# Patient Record
Sex: Female | Born: 1965 | Race: White | Hispanic: No | Marital: Married | Smoking: Never smoker
Health system: Southern US, Community
[De-identification: ages and names within clinical notes are randomized; demographics above are authoritative.]

## PROBLEM LIST (undated history)

## (undated) DIAGNOSIS — F419 Anxiety disorder, unspecified: Secondary | ICD-10-CM

## (undated) DIAGNOSIS — M1990 Unspecified osteoarthritis, unspecified site: Secondary | ICD-10-CM

## (undated) DIAGNOSIS — I10 Essential (primary) hypertension: Secondary | ICD-10-CM

## (undated) DIAGNOSIS — F32A Depression, unspecified: Secondary | ICD-10-CM

## (undated) DIAGNOSIS — F329 Major depressive disorder, single episode, unspecified: Secondary | ICD-10-CM

## (undated) DIAGNOSIS — K219 Gastro-esophageal reflux disease without esophagitis: Secondary | ICD-10-CM

## (undated) HISTORY — PX: CHOLECYSTECTOMY: SHX55

## (undated) HISTORY — PX: ABDOMINAL HYSTERECTOMY: SHX81

---
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
## 2013-09-27 ENCOUNTER — Ambulatory Visit: Payer: Self-pay | Admitting: Family Medicine

## 2014-05-16 ENCOUNTER — Ambulatory Visit: Payer: Self-pay | Admitting: Emergency Medicine

## 2015-07-23 ENCOUNTER — Ambulatory Visit
Admission: EM | Admit: 2015-07-23 | Discharge: 2015-07-23 | Disposition: A | Discharge status: Home / Self Care | Payer: BC Managed Care – PPO | Attending: Family Medicine | Admitting: Family Medicine

## 2015-07-23 DIAGNOSIS — B349 Viral infection, unspecified: Secondary | ICD-10-CM | POA: Diagnosis not present

## 2015-07-23 HISTORY — DX: Gastro-esophageal reflux disease without esophagitis: K21.9

## 2015-07-23 HISTORY — DX: Essential (primary) hypertension: I10

## 2015-07-23 LAB — RAPID INFLUENZA A&B ANTIGENS
Influenza A (ARMC): NOT DETECTED
Influenza B (ARMC): NOT DETECTED

## 2015-07-23 LAB — RAPID STREP SCREEN (MED CTR MEBANE ONLY): STREPTOCOCCUS, GROUP A SCREEN (DIRECT): NEGATIVE

## 2015-07-23 NOTE — ED Notes (Signed)
Started 3 days ago with headache, sore throat. + bodyaches started yesterday. + chills

## 2015-07-23 NOTE — Discharge Instructions (Signed)
Tylenol/Motrin as needed, Delysm as needed, Claritin as needed

## 2015-07-23 NOTE — ED Provider Notes (Signed)
CSN: [id number]     Arrival date & time 07/23/15  1501 History   First MD Initiated Contact with Patient 07/23/15 1604     Chief Complaint  Patient presents with  . Influenza   (Consider location/radiation/quality/duration/timing/severity/associated sxs/prior Treatment) HPI: Patient presents today with symptoms of headache, sore throat, myalgias, chills for the last few days. Patient is afebrile in the office today. Denies any chest pain, shortness of breath or nausea or vomiting or diarrhea. She is not taking any medications today.  Past Medical History  Diagnosis Date  . Hypertension   . GERD (gastroesophageal reflux disease)    Past Surgical History  Procedure Laterality Date  . Cholecystectomy    . Abdominal hysterectomy    . Cesarean section  1991   Family History  Problem Relation Age of Onset  . Hypertension Mother   . Diabetes Mother   . Heart failure Father    Social History  Substance Use Topics  . Smoking status: Passive Smoke Exposure - Never Smoker  . Smokeless tobacco: None  . Alcohol Use: No   OB History    No data available     Review of Systems: Negative except mentioned above.   Allergies  Review of patient's allergies indicates no known allergies.  Home Medications   Prior to Admission medications   Medication Sig Start Date End Date Taking? Authorizing Provider  buPROPion (ZYBAN) 150 MG 12 hr tablet Take 150 mg by mouth 2 (two) times daily.   Yes Historical Provider, MD  estradiol (ESTRACE) 1 MG tablet Take 1 mg by mouth daily.   Yes Historical Provider, MD  lansoprazole (PREVACID) 30 MG capsule Take 30 mg by mouth daily at 12 noon.   Yes Historical Provider, MD  lisinopril-hydrochlorothiazide (PRINZIDE,ZESTORETIC) 10-12.5 MG tablet Take 1 tablet by mouth daily.   Yes Historical Provider, MD   Meds Ordered and Administered this Visit  Medications - No data to display  BP 143/88 mmHg  Pulse 91  Temp(Src) 97.4 F (36.3 C) (Tympanic)  Resp 16   Ht  (1.753 m)  Wt 252 lb (114.306 kg)  BMI 37.20 kg/m2  SpO2 98% No data found.   Physical Exam   GENERAL: NAD HEENT: mild pharyngeal erythema, no exudate, no erythema of TMs, no cervical LAD RESP: CTA B CARD: RRR ABD: +BS, NT/ND, no rebound or guarding  NEURO: CN II-XII grossly intact, negative meningeal signs   ED Course  Procedures (including critical care time)  Labs Review Labs Reviewed  RAPID STREP SCREEN (NOT AT [facility])  RAPID INFLUENZA A&B ANTIGENS ([facility] ONLY)    Imaging Review No results found.  MDM  A/P: Viral illness-rapid strep test was negative as well as flu screen, will treat patient with Claritin when necessary, Delsym when necessary, Tylenol/Motrin when necessary, rest, hydration, seek medical attention if symptoms persist or worsen as discussed.    Jolene Provost, MD 07/23/15 [phone]

## 2015-07-25 LAB — CULTURE, GROUP A STREP (THRC)

## 2016-07-12 ENCOUNTER — Ambulatory Visit
Admission: EM | Admit: 2016-07-12 | Discharge: 2016-07-12 | Disposition: A | Discharge status: Home / Self Care | Payer: BC Managed Care – PPO | Attending: Family Medicine | Admitting: Family Medicine

## 2016-07-12 ENCOUNTER — Ambulatory Visit (INDEPENDENT_AMBULATORY_CARE_PROVIDER_SITE_OTHER): Payer: BC Managed Care – PPO

## 2016-07-12 DIAGNOSIS — M79642 Pain in left hand: Secondary | ICD-10-CM

## 2016-07-12 MED ORDER — TRAMADOL HCL 50 MG PO TABS: 50.0000 mg | ORAL_TABLET | Freq: Three times a day (TID) | ORAL | 0 refills | Status: DC | PRN

## 2016-07-12 NOTE — ED Provider Notes (Signed)
[facility]    CSN: [id number] Arrival date & time: 07/12/16  1706  History   Chief Complaint Chief Complaint  Patient presents with  . Hand Pain    left   HPI 51 year old female presents with complaints of left hand pain.  Patient states that she was trying on shoes and attempted to don a shoe. In doing so she injured her left hand (at the MCP). She reports that she heard a "pop". She reports associated swelling. She has been icing the area and using ibuprofen without improvement. She reports continued pain. Decreased range of motion of the left index finger. No other associated symptoms. Pain is moderate in severity. No other complaints or concerns at this time.  Past Medical History:  Diagnosis Date  . GERD (gastroesophageal reflux disease)   . Hypertension    Past Surgical History:  Procedure Laterality Date  . ABDOMINAL HYSTERECTOMY    . CESAREAN SECTION  1991  . CHOLECYSTECTOMY     OB History    No data available     Home Medications    Prior to Admission medications   Medication Sig Start Date End Date Taking? Authorizing Provider  buPROPion (ZYBAN) 150 MG 12 hr tablet Take 150 mg by mouth 2 (two) times daily.   Yes Historical Provider, MD  estradiol (ESTRACE) 1 MG tablet Take 1 mg by mouth daily.   Yes Historical Provider, MD  lansoprazole (PREVACID) 30 MG capsule Take 30 mg by mouth daily at 12 noon.   Yes Historical Provider, MD  lisinopril-hydrochlorothiazide (PRINZIDE,ZESTORETIC) 10-12.5 MG tablet Take 1 tablet by mouth daily.   Yes Historical Provider, MD  traMADol (ULTRAM) 50 MG tablet Take 1 tablet (50 mg total) by mouth every 8 (eight) hours as needed. 07/12/16   Tommie SamsJayce G Kearston Putman, DO   Family History Family History  Problem Relation Age of Onset  . Hypertension Mother   . Diabetes Mother   . Heart failure Father    Social History Social History  Substance Use Topics  . Smoking status: Passive Smoke Exposure - Never Smoker  . Smokeless  tobacco: Never Used  . Alcohol use No   Allergies   Patient has no known allergies.  Review of Systems Review of Systems  Constitutional: Negative.   Musculoskeletal:       Left hand pain.   Physical Exam Triage Vital Signs ED Triage Vitals  Enc Vitals Group     BP 07/12/16 1739 129/80     Pulse Rate 07/12/16 1739 88     Resp 07/12/16 1739 17     Temp 07/12/16 1739 98.3 F (36.8 C)     Temp Source 07/12/16 1739 Oral     SpO2 07/12/16 1739 98 %     Weight 07/12/16 1737 260 lb (117.9 kg)     Height 07/12/16 1737 5\' 8"  (1.727 m)     Head Circumference --      Peak Flow --      Pain Score 07/12/16 1739 5     Pain Loc --      Pain Edu? --      Excl. in GC? --    Updated Vital Signs BP 129/80 (BP Location: Left Arm)   Pulse 88   Temp 98.3 F (36.8 C) (Oral)   Resp 17   Ht 5\' 8"  (1.727 m)   Wt 260 lb (117.9 kg)   SpO2 98%   BMI 39.53 kg/m   Physical Exam  Constitutional: She appears  well-developed. No distress.  Pulmonary/Chest: Effort normal.  Musculoskeletal:  Left hand - swelling at the 1st MCP (dorsally), extending proximally (slightly). Tenderness at the 1st MCP.  Neurovascularly intact.  Skin: Skin is warm. No rash noted.  Psychiatric: She has a normal mood and affect.  Vitals reviewed.  UC Treatments / Results  Labs (all labs ordered are listed, but only abnormal results are displayed) Labs Reviewed - No data to display  EKG  EKG Interpretation None      Radiology Dg Hand Complete Left  Result Date: 07/12/2016 CLINICAL DATA:  LEFT hand pain, heard a pop and had swelling after pulling hard while putting on a shoe on Sunday, pain fourth finger EXAM: LEFT HAND - COMPLETE 3+ VIEW COMPARISON:  None FINDINGS: Fingers partially superimposed on lateral view limiting assessment. Osseous mineralization normal. Mild degenerative changes at first CMC joint. Remaining joint spaces preserved. No acute fracture, dislocation, or bone destruction. IMPRESSION: No  acute osseous abnormalities. Electronically Signed   By: Mark  Boles M.D.   On: 07/12/2016 18:15   Procedures Procedures (including critical care time)  Medications Ordered in UC Medications - No data to display  Initial Impression / Assessment and Plan / UC Course  I have reviewed the triage vital signs and the nursing notes.  Pertinent labs & imaging results that were available during my care of the patient were reviewed by me and considered in my medical decision making (see chart for details).     50  year old female presenting with a left hand injury. X-ray negative. Supportive care. RICE. PRN Tramadol.  Final Clinical Impressions(s) / UC Diagnoses   Final diagnoses:  Left hand pain    New Prescriptions New Prescriptions   TRAMADOL (ULTRAM) 50 MG TABLET    Take 1 tablet (50 mg total) by mouth every 8 (eight) hours as needed.     Tommie Sams, DO 07/12/16 1610

## 2016-07-12 NOTE — Discharge Instructions (Signed)
RICE.  Ibuprofen as needed.  Tramadol as needed.  Take care  Dr. Adriana Simasook

## 2016-07-12 NOTE — ED Triage Notes (Signed)
Patient complains of left hand pain that occurred after she was trying to put on a shoe on Sunday. She states that she pulled the shoe hard and heard a pop in her hand and now has swelling. Patient is unable to hold any weight with finger. Patient states that she has been icing area and using motrin with little relief.

## 2016-08-17 NOTE — Progress Notes (Signed)
Ineligible as a potential kidney dnor due to HTN on multiople medications and significant family hx of HTN/ESRD

## 2017-07-24 ENCOUNTER — Ambulatory Visit (INDEPENDENT_AMBULATORY_CARE_PROVIDER_SITE_OTHER): Payer: BC Managed Care – PPO

## 2017-07-24 ENCOUNTER — Other Ambulatory Visit: Payer: Self-pay

## 2017-07-24 ENCOUNTER — Ambulatory Visit
Admission: EM | Admit: 2017-07-24 | Discharge: 2017-07-24 | Disposition: A | Discharge status: Home / Self Care | Payer: BC Managed Care – PPO | Attending: Family Medicine | Admitting: Family Medicine

## 2017-07-24 DIAGNOSIS — R05 Cough: Secondary | ICD-10-CM

## 2017-07-24 DIAGNOSIS — R059 Cough, unspecified: Secondary | ICD-10-CM

## 2017-07-24 MED ORDER — HYDROCOD POLST-CPM POLST ER 10-8 MG/5ML PO SUER: 5.0000 mL | Freq: Two times a day (BID) | ORAL | 0 refills | Status: DC | PRN

## 2017-07-24 NOTE — ED Triage Notes (Signed)
Pt with 10 days of "deep cough" non-productive and worse at night. No fever, or other sx except some congestion mostly at night.

## 2017-07-24 NOTE — ED Provider Notes (Signed)
[facility]    CSN: [id number] Arrival date & time: 07/24/17  1727  History   Chief Complaint Chief Complaint  Patient presents with  . Cough   HPI 52 year old female presents with cough.  Patient reports a 10-day history of cough.  Nonproductive.  Dry.  No associated fever.  She reports occasional shortness of breath with paroxysms of cough.  She is used [employer] with improvement but no resolution.  No known exacerbating factors.  No other associated symptoms.  No other complaints at this time.  Of note, patient states that she has a history of pneumonia.  This is her primary concern today.  Past Medical History:  Diagnosis Date  . GERD (gastroesophageal reflux disease)   . GERD (gastroesophageal reflux disease)   . Hypertension    Past Surgical History:  Procedure Laterality Date  . ABDOMINAL HYSTERECTOMY    . CESAREAN SECTION  1991  . CHOLECYSTECTOMY      OB History    No data available       Home Medications    Prior to Admission medications   Medication Sig Start Date End Date Taking? Authorizing Provider  chlorpheniramine-HYDROcodone (TUSSIONEX PENNKINETIC ER) 10-8 MG/5ML SUER Take 5 mLs by mouth every 12 (twelve) hours as needed. 07/24/17   Tommie Samsook, [employer] G, DO  estradiol (ESTRACE) 1 MG tablet Take 1 mg by mouth daily.    [provider]  lansoprazole (PREVACID) 30 MG capsule Take 30 mg by mouth daily at 12 noon.    [provider]  lisinopril-hydrochlorothiazide (PRINZIDE,ZESTORETIC) 10-12.5 MG tablet Take 1 tablet by mouth daily.    [provider]    Family History Family History  Problem Relation Age of Onset  . Hypertension Mother   . Diabetes Mother   . Heart failure Father     Social History Social History   Tobacco Use  . Smoking status: Passive Smoke Exposure - Never Smoker  . Smokeless tobacco: Never Used  Substance Use Topics  . Alcohol use: No  . Drug use: No     Allergies   Patient has  no known allergies.   Review of Systems Review of Systems  Constitutional: Negative.   Respiratory: Positive for cough.    Physical Exam Triage Vital Signs ED Triage Vitals  Enc Vitals Group     BP 07/24/17 1747 133/81     Pulse Rate 07/24/17 1747 71     Resp 07/24/17 1747 20     Temp 07/24/17 1747 98.2 F (36.8 C)     Temp Source 07/24/17 1747 Oral     SpO2 07/24/17 1747 96 %     Weight 07/24/17 1747 230 lb (104.3 kg)     Height 07/24/17 1747 5' 8.5" (1.74 m)     Head Circumference --      Peak Flow --      Pain Score 07/24/17 2003 3     Pain Loc --      Pain Edu? --      Excl. in GC? --    Updated Vital Signs BP 133/81 (BP Location: Left Arm)   Pulse 71   Temp 98.2 F (36.8 C) (Oral)   Resp 20   Ht 5' 8.5" (1.74 m)   Wt 230 lb (104.3 kg)   SpO2 96%   BMI 34.46 kg/m     Physical Exam  Constitutional: She is oriented to person, place, and time. She appears well-developed and well-nourished. No distress.  HENT:  Head: Normocephalic and atraumatic.  Mouth/Throat: Oropharynx is clear and moist.  Cardiovascular: Normal rate and regular rhythm.  No murmur heard. Pulmonary/Chest: Effort normal and breath sounds normal. She has no wheezes. She has no rales.  Neurological: She is alert and oriented to person, place, and time.  Psychiatric: She has a normal mood and affect. Her behavior is normal.  Nursing note and vitals reviewed.  UC Treatments / Results  Labs (all labs ordered are listed, but only abnormal results are displayed) Labs Reviewed - No data to display  EKG  EKG Interpretation None       Radiology Dg Chest 2 View  Result Date: 07/24/2017 CLINICAL DATA:  Shortness of breath, cough. EXAM: CHEST  2 VIEW COMPARISON:  None. FINDINGS: The heart size and mediastinal contours are within normal limits. Both lungs are clear. No pneumothorax or pleural effusion is noted. The visualized skeletal structures are unremarkable. IMPRESSION: No active  cardiopulmonary disease. Electronically Signed   By: Lupita Raider, M.D.   On: 07/24/2017 19:45    Procedures Procedures (including critical care time)  Medications Ordered in UC Medications - No data to display   Initial Impression / Assessment and Plan / UC Course  I have reviewed the triage vital signs and the nursing notes.  Pertinent labs & imaging results that were available during my care of the patient were reviewed by me and considered in my medical decision making (see chart for details).     52 year old female presents with cough.  Appears to be viral in origin or post viral.  X-ray negative.  Tussionex for cough.  Supportive care.  Final Clinical Impressions(s) / UC Diagnoses   Final diagnoses:  Cough    ED Discharge Orders        Ordered    chlorpheniramine-HYDROcodone (TUSSIONEX PENNKINETIC ER) 10-8 MG/5ML SUER  Every 12 hours PRN     07/24/17 1957     Controlled Substance Prescriptions [place] Controlled Substance Registry consulted? No   Tommie Sams, [place] 07/24/17 2151

## 2018-12-20 ENCOUNTER — Other Ambulatory Visit: Payer: Self-pay | Admitting: Sports Physician

## 2018-12-20 ENCOUNTER — Ambulatory Visit
Admission: RE | Admit: 2018-12-20 | Discharge: 2018-12-20 | Disposition: A | Discharge status: Home / Self Care | Payer: BC Managed Care – PPO | Source: Ambulatory Visit | Attending: Sports Physician | Admitting: Sports Physician

## 2018-12-20 ENCOUNTER — Other Ambulatory Visit: Payer: Self-pay

## 2018-12-20 ENCOUNTER — Ambulatory Visit
Admission: RE | Admit: 2018-12-20 | Discharge: 2018-12-20 | Disposition: A | Discharge status: Home / Self Care | Payer: BC Managed Care – PPO | Attending: Sports Physician | Admitting: Sports Physician

## 2018-12-20 DIAGNOSIS — M25512 Pain in left shoulder: Secondary | ICD-10-CM

## 2019-07-08 ENCOUNTER — Other Ambulatory Visit: Payer: Self-pay | Admitting: Orthopedic Surgery

## 2019-07-08 DIAGNOSIS — M25512 Pain in left shoulder: Secondary | ICD-10-CM

## 2019-07-16 ENCOUNTER — Other Ambulatory Visit: Payer: Self-pay

## 2019-07-16 ENCOUNTER — Ambulatory Visit
Admission: RE | Admit: 2019-07-16 | Discharge: 2019-07-16 | Disposition: A | Discharge status: Home / Self Care | Payer: BC Managed Care – PPO | Source: Ambulatory Visit | Attending: Orthopedic Surgery | Admitting: Orthopedic Surgery

## 2019-07-16 DIAGNOSIS — M25512 Pain in left shoulder: Secondary | ICD-10-CM

## 2019-11-04 ENCOUNTER — Other Ambulatory Visit: Payer: Self-pay | Admitting: Orthopedic Surgery

## 2019-11-12 ENCOUNTER — Other Ambulatory Visit: Payer: Self-pay

## 2019-11-12 ENCOUNTER — Encounter
Admission: RE | Admit: 2019-11-12 | Discharge: 2019-11-12 | Disposition: A | Discharge status: Home / Self Care | Payer: BC Managed Care – PPO | Source: Ambulatory Visit | Attending: Orthopedic Surgery | Admitting: Orthopedic Surgery

## 2019-11-12 HISTORY — DX: Depression, unspecified: F32.A

## 2019-11-12 HISTORY — DX: Unspecified osteoarthritis, unspecified site: M19.90

## 2019-11-12 HISTORY — DX: Anxiety disorder, unspecified: F41.9

## 2019-11-12 NOTE — Patient Instructions (Signed)
Your procedure is scheduled on: Monday November 18, 2019 Report to Day Surgery. To find out your arrival time please call [phone] between 1PM - 3PM on Friday November 15, 2019.  Remember: Instructions that are not followed completely may result in serious medical risk,  up to and including death, or upon the discretion of your surgeon and anesthesiologist your  surgery may need to be rescheduled.     _X__ 1. Do not eat food after midnight the night before your procedure.                 No gum chewing or hard candies. You may drink clear liquids up to 2 hours                 before you are scheduled to arrive for your surgery- DO not drink clear                 liquids within 2 hours of the start of your surgery.                 Clear Liquids include:  water, apple juice without pulp, clear Gatorade, G2 or                  Gatorade Zero (avoid Red/Purple/Blue), Black Coffee or Tea (Do not add                 anything to coffee or tea).  __X__2.   Complete the carbohydrate drink provided to you, 2 hours before arrival.  __X__3.  On the morning of surgery brush your teeth with toothpaste and water, you                may rinse your mouth with mouthwash if you wish.  Do not swallow any toothpaste of mouthwash.     _X__ 4.  No Alcohol for 24 hours before or after surgery.   _X__ 5.  Do Not Smoke or use e-cigarettes For 24 Hours Prior to Your Surgery.                 Do not use any chewable tobacco products for at least 6 hours prior to                 Surgery.  _X__  6.  Do not use any recreational drugs (marijuana, cocaine, heroin, ecstacy, MDMA or other)                For at least one week prior to your surgery.  Combination of these drugs with anesthesia                May have life threatening results.  __X__ 7.  Notify your doctor if there is any change in your medical condition      (cold, fever, infections).     Do not wear jewelry, make-up, hairpins, clips  or nail polish. Do not wear lotions, powders, or perfumes. You may wear deodorant. Do not shave 48 hours prior to surgery. Men may shave face and neck. Do not bring valuables to the hospital.    [facility] is not responsible for any belongings or valuables.  Contacts, dentures or bridgework may not be worn into surgery. Leave your suitcase in the car. After surgery it may be brought to your room. For patients admitted to the hospital, discharge time is determined by your treatment team.   Patients discharged the day of surgery will not be allowed to  drive home.   Make arrangements for someone to be with you for the first 24 hours of your Same Day Discharge.   __X__ Take these medicines the morning of surgery with A SIP OF WATER:    1. pantoprazole (PROTONIX) 40 MG  2. buPROPion (WELLBUTRIN SR) 150 MG  __X__ Use CHG Soap (or wipes) as directed   __X__ Stop aspirin as instructed by your doctor.   __X__ Stop Anti-inflammatories such as ibuprofen, Aleve, naproxen, and or BC powders.    __X__ Stop supplements until after surgery.    __X__ Do not add any herbal supplements before your surgery.

## 2019-11-14 ENCOUNTER — Other Ambulatory Visit: Payer: BC Managed Care – PPO

## 2019-11-14 ENCOUNTER — Other Ambulatory Visit: Payer: Self-pay

## 2019-11-14 ENCOUNTER — Encounter
Admission: RE | Admit: 2019-11-14 | Discharge: 2019-11-14 | Disposition: A | Discharge status: Home / Self Care | Payer: BC Managed Care – PPO | Source: Ambulatory Visit | Attending: Orthopedic Surgery | Admitting: Orthopedic Surgery

## 2019-11-14 DIAGNOSIS — Z20822 Contact with and (suspected) exposure to covid-19: Secondary | ICD-10-CM | POA: Insufficient documentation

## 2019-11-14 DIAGNOSIS — Z01818 Encounter for other preprocedural examination: Secondary | ICD-10-CM | POA: Diagnosis not present

## 2019-11-14 LAB — BASIC METABOLIC PANEL
Anion gap: 10 (ref 5–15)
BUN: 15 mg/dL (ref 6–20)
CO2: 29 mmol/L (ref 22–32)
Calcium: 9 mg/dL (ref 8.9–10.3)
Chloride: 101 mmol/L (ref 98–111)
Creatinine, Ser: 0.87 mg/dL (ref 0.44–1.00)
GFR calc Af Amer: >60 mL/min (ref >60)
GFR calc non Af Amer: >60 mL/min (ref >60)
Glucose, Bld: 90 mg/dL (ref 70–99)
Potassium: 3.7 mmol/L (ref 3.5–5.1)
Sodium: 140 mmol/L (ref 135–145)

## 2019-11-14 LAB — SARS CORONAVIRUS 2 (TAT 6-24 HRS): SARS Coronavirus 2: NEGATIVE

## 2019-11-14 LAB — CBC
HCT: 42.2 % (ref 36.0–46.0)
Hemoglobin: 14.3 g/dL (ref 12.0–15.0)
MCH: 28.6 pg (ref 26.0–34.0)
MCHC: 33.9 g/dL (ref 30.0–36.0)
MCV: 84.4 fL (ref 80.0–100.0)
Platelets: 279 10*3/uL (ref 150–400)
RBC: 5 MIL/uL (ref 3.87–5.11)
RDW: 13 % (ref 11.5–15.5)
WBC: 7.7 10*3/uL (ref 4.0–10.5)
nRBC: 0 % (ref 0.0–0.2)

## 2019-11-18 ENCOUNTER — Encounter
Admission: RE | Disposition: A | Discharge status: Home / Self Care | Payer: Self-pay | Source: Home / Self Care | Attending: Orthopedic Surgery

## 2019-11-18 ENCOUNTER — Ambulatory Visit: Payer: BC Managed Care – PPO

## 2019-11-18 ENCOUNTER — Ambulatory Visit
Admission: RE | Admit: 2019-11-18 | Discharge: 2019-11-18 | Disposition: A | Discharge status: Home / Self Care | Payer: BC Managed Care – PPO | Attending: Orthopedic Surgery | Admitting: Orthopedic Surgery

## 2019-11-18 ENCOUNTER — Ambulatory Visit: Payer: BC Managed Care – PPO | Admitting: Anesthesiology

## 2019-11-18 ENCOUNTER — Encounter: Payer: Self-pay | Admitting: Orthopedic Surgery

## 2019-11-18 ENCOUNTER — Other Ambulatory Visit: Payer: Self-pay

## 2019-11-18 DIAGNOSIS — I10 Essential (primary) hypertension: Secondary | ICD-10-CM | POA: Diagnosis not present

## 2019-11-18 DIAGNOSIS — Z7982 Long term (current) use of aspirin: Secondary | ICD-10-CM | POA: Insufficient documentation

## 2019-11-18 DIAGNOSIS — M7521 Bicipital tendinitis, right shoulder: Secondary | ICD-10-CM | POA: Diagnosis not present

## 2019-11-18 DIAGNOSIS — X58XXXA Exposure to other specified factors, initial encounter: Secondary | ICD-10-CM | POA: Insufficient documentation

## 2019-11-18 DIAGNOSIS — K219 Gastro-esophageal reflux disease without esophagitis: Secondary | ICD-10-CM | POA: Diagnosis not present

## 2019-11-18 DIAGNOSIS — M1711 Unilateral primary osteoarthritis, right knee: Secondary | ICD-10-CM | POA: Insufficient documentation

## 2019-11-18 DIAGNOSIS — Z79899 Other long term (current) drug therapy: Secondary | ICD-10-CM | POA: Diagnosis not present

## 2019-11-18 DIAGNOSIS — S46011A Strain of muscle(s) and tendon(s) of the rotator cuff of right shoulder, initial encounter: Secondary | ICD-10-CM | POA: Diagnosis present

## 2019-11-18 DIAGNOSIS — M19011 Primary osteoarthritis, right shoulder: Secondary | ICD-10-CM | POA: Insufficient documentation

## 2019-11-18 DIAGNOSIS — Z419 Encounter for procedure for purposes other than remedying health state, unspecified: Secondary | ICD-10-CM

## 2019-11-18 DIAGNOSIS — F419 Anxiety disorder, unspecified: Secondary | ICD-10-CM | POA: Diagnosis not present

## 2019-11-18 HISTORY — PX: SHOULDER ARTHROSCOPY WITH ROTATOR CUFF REPAIR AND OPEN BICEPS TENODESIS: SHX6677

## 2019-11-18 SURGERY — SHOULDER ARTHROSCOPY WITH SUBACROMIAL DECOMPRESSION AND DISTAL CLAVICLE EXCISION
Anesthesia: General | Site: Shoulder | Laterality: Left

## 2019-11-18 MED ORDER — DEXAMETHASONE SODIUM PHOSPHATE 10 MG/ML IJ SOLN
INTRAMUSCULAR | Status: AC
  Filled 2019-11-18: qty 1

## 2019-11-18 MED ORDER — FENTANYL CITRATE (PF) 100 MCG/2ML IJ SOLN: 25.0000 ug | INTRAMUSCULAR | Status: DC | PRN

## 2019-11-18 MED ORDER — CHLORHEXIDINE GLUCONATE 0.12 % MT SOLN
OROMUCOSAL | Status: AC
  Administered 2019-11-18: 15 mL via OROMUCOSAL
  Filled 2019-11-18: qty 15

## 2019-11-18 MED ORDER — SUGAMMADEX SODIUM 500 MG/5ML IV SOLN
INTRAVENOUS | Status: AC
  Filled 2019-11-18: qty 5

## 2019-11-18 MED ORDER — FENTANYL CITRATE (PF) 100 MCG/2ML IJ SOLN: 50.0000 ug | Freq: Once | INTRAMUSCULAR | Status: AC

## 2019-11-18 MED ORDER — ROCURONIUM BROMIDE 10 MG/ML (PF) SYRINGE
PREFILLED_SYRINGE | INTRAVENOUS | Status: AC
  Filled 2019-11-18: qty 10

## 2019-11-18 MED ORDER — FENTANYL CITRATE (PF) 100 MCG/2ML IJ SOLN
INTRAMUSCULAR | Status: AC
  Filled 2019-11-18: qty 2

## 2019-11-18 MED ORDER — ONDANSETRON HCL 4 MG/2ML IJ SOLN
INTRAMUSCULAR | Status: AC
  Filled 2019-11-18: qty 2

## 2019-11-18 MED ORDER — MIDAZOLAM HCL 2 MG/2ML IJ SOLN
INTRAMUSCULAR | Status: AC
  Administered 2019-11-18: 1 mg via INTRAVENOUS
  Filled 2019-11-18: qty 2

## 2019-11-18 MED ORDER — LACTATED RINGERS IV SOLN: INTRAVENOUS | Status: DC

## 2019-11-18 MED ORDER — FENTANYL CITRATE (PF) 100 MCG/2ML IJ SOLN
INTRAMUSCULAR | Status: AC
  Administered 2019-11-18: 50 ug via INTRAVENOUS
  Filled 2019-11-18: qty 2

## 2019-11-18 MED ORDER — EPHEDRINE 5 MG/ML INJ
INTRAVENOUS | Status: AC
  Filled 2019-11-18: qty 10

## 2019-11-18 MED ORDER — LACTATED RINGERS IV SOLN: INTRAVENOUS | Status: DC | PRN

## 2019-11-18 MED ORDER — CEFAZOLIN SODIUM-DEXTROSE 2-4 GM/100ML-% IV SOLN
2.0000 g | INTRAVENOUS | Status: AC
  Administered 2019-11-18: 2 g via INTRAVENOUS

## 2019-11-18 MED ORDER — MIDAZOLAM HCL 2 MG/2ML IJ SOLN
INTRAMUSCULAR | Status: AC
  Filled 2019-11-18: qty 2

## 2019-11-18 MED ORDER — ONDANSETRON HCL 4 MG/2ML IJ SOLN
INTRAMUSCULAR | Status: DC | PRN
  Administered 2019-11-18: 4 mg via INTRAVENOUS

## 2019-11-18 MED ORDER — PROPOFOL 10 MG/ML IV BOLUS
INTRAVENOUS | Status: AC
  Filled 2019-11-18: qty 40

## 2019-11-18 MED ORDER — BUPIVACAINE LIPOSOME 1.3 % IJ SUSP
INTRAMUSCULAR | Status: AC
  Filled 2019-11-18: qty 20

## 2019-11-18 MED ORDER — GLYCOPYRROLATE 0.2 MG/ML IJ SOLN
INTRAMUSCULAR | Status: DC | PRN
  Administered 2019-11-18: .2 mg via INTRAVENOUS

## 2019-11-18 MED ORDER — SUCCINYLCHOLINE CHLORIDE 20 MG/ML IJ SOLN
INTRAMUSCULAR | Status: DC | PRN
  Administered 2019-11-18: 160 mg via INTRAVENOUS

## 2019-11-18 MED ORDER — ACETAMINOPHEN 500 MG PO TABS: 1000.0000 mg | ORAL_TABLET | Freq: Three times a day (TID) | ORAL | 2 refills | Status: AC

## 2019-11-18 MED ORDER — CHLORHEXIDINE GLUCONATE 0.12 % MT SOLN: 15.0000 mL | Freq: Once | OROMUCOSAL | Status: AC

## 2019-11-18 MED ORDER — CEFAZOLIN SODIUM-DEXTROSE 2-4 GM/100ML-% IV SOLN
INTRAVENOUS | Status: AC
  Filled 2019-11-18: qty 100

## 2019-11-18 MED ORDER — LIDOCAINE HCL (CARDIAC) PF 100 MG/5ML IV SOSY: PREFILLED_SYRINGE | INTRAVENOUS | Status: DC | PRN

## 2019-11-18 MED ORDER — DEXMEDETOMIDINE HCL IN NACL 80 MCG/20ML IV SOLN
INTRAVENOUS | Status: AC
  Filled 2019-11-18: qty 20

## 2019-11-18 MED ORDER — PROPOFOL 10 MG/ML IV BOLUS
INTRAVENOUS | Status: DC | PRN
  Administered 2019-11-18: 200 mg via INTRAVENOUS

## 2019-11-18 MED ORDER — OXYCODONE HCL 5 MG PO TABS: 5.0000 mg | ORAL_TABLET | ORAL | 0 refills | Status: AC | PRN

## 2019-11-18 MED ORDER — BUPIVACAINE HCL (PF) 0.5 % IJ SOLN
INTRAMUSCULAR | Status: DC | PRN
  Administered 2019-11-18: 7 mL via PERINEURAL
  Administered 2019-11-18: 3 mL via PERINEURAL

## 2019-11-18 MED ORDER — DEXAMETHASONE SODIUM PHOSPHATE 10 MG/ML IJ SOLN
INTRAMUSCULAR | Status: DC | PRN
  Administered 2019-11-18: 10 mg via INTRAVENOUS

## 2019-11-18 MED ORDER — ONDANSETRON 4 MG PO TBDP: 4.0000 mg | ORAL_TABLET | Freq: Three times a day (TID) | ORAL | 0 refills | Status: AC | PRN

## 2019-11-18 MED ORDER — GLYCOPYRROLATE 0.2 MG/ML IJ SOLN
INTRAMUSCULAR | Status: AC
  Filled 2019-11-18: qty 1

## 2019-11-18 MED ORDER — OXYCODONE HCL 5 MG/5ML PO SOLN: 5.0000 mg | Freq: Once | ORAL | Status: DC | PRN

## 2019-11-18 MED ORDER — ROCURONIUM BROMIDE 100 MG/10ML IV SOLN
INTRAVENOUS | Status: DC | PRN
  Administered 2019-11-18: 10 mg via INTRAVENOUS
  Administered 2019-11-18: 15 mg via INTRAVENOUS
  Administered 2019-11-18 (×3): 10 mg via INTRAVENOUS
  Administered 2019-11-18: 50 mg via INTRAVENOUS
  Administered 2019-11-18: 10 mg via INTRAVENOUS

## 2019-11-18 MED ORDER — MIDAZOLAM HCL 2 MG/2ML IJ SOLN: 1.0000 mg | Freq: Once | INTRAMUSCULAR | Status: AC

## 2019-11-18 MED ORDER — LIDOCAINE HCL (PF) 2 % IJ SOLN
INTRAMUSCULAR | Status: AC
  Filled 2019-11-18: qty 10

## 2019-11-18 MED ORDER — ACETAMINOPHEN 10 MG/ML IV SOLN
INTRAVENOUS | Status: AC
  Filled 2019-11-18: qty 100

## 2019-11-18 MED ORDER — ORAL CARE MOUTH RINSE: 15.0000 mL | Freq: Once | OROMUCOSAL | Status: AC

## 2019-11-18 MED ORDER — ASPIRIN EC 325 MG PO TBEC
325.0000 mg | DELAYED_RELEASE_TABLET | Freq: Every day | ORAL | 0 refills | Status: AC
Start: 2019-11-18 — End: 2019-12-02

## 2019-11-18 MED ORDER — OXYCODONE HCL 5 MG PO TABS: 5.0000 mg | ORAL_TABLET | Freq: Once | ORAL | Status: DC | PRN

## 2019-11-18 MED ORDER — BUPIVACAINE HCL (PF) 0.5 % IJ SOLN
INTRAMUSCULAR | Status: AC
  Filled 2019-11-18: qty 10

## 2019-11-18 MED ORDER — DEXMEDETOMIDINE HCL IN NACL 200 MCG/50ML IV SOLN
INTRAVENOUS | Status: DC | PRN
  Administered 2019-11-18: 8 ug via INTRAVENOUS
  Administered 2019-11-18: 4 ug via INTRAVENOUS

## 2019-11-18 MED ORDER — PHENYLEPHRINE HCL (PRESSORS) 10 MG/ML IV SOLN
INTRAVENOUS | Status: AC
  Filled 2019-11-18: qty 1

## 2019-11-18 MED ORDER — SUCCINYLCHOLINE CHLORIDE 200 MG/10ML IV SOSY
PREFILLED_SYRINGE | INTRAVENOUS | Status: AC
  Filled 2019-11-18: qty 10

## 2019-11-18 MED ORDER — LIDOCAINE HCL (PF) 1 % IJ SOLN
INTRAMUSCULAR | Status: AC
  Filled 2019-11-18: qty 5

## 2019-11-18 MED ORDER — SUGAMMADEX SODIUM 500 MG/5ML IV SOLN
INTRAVENOUS | Status: DC | PRN
  Administered 2019-11-18: 500 mg via INTRAVENOUS

## 2019-11-18 MED ORDER — BUPIVACAINE LIPOSOME 1.3 % IJ SUSP
INTRAMUSCULAR | Status: DC | PRN
  Administered 2019-11-18: 7 mL via PERINEURAL
  Administered 2019-11-18: 13 mL via PERINEURAL

## 2019-11-18 SURGICAL SUPPLY — 78 items
ADAPTER IRRIG TUBE 2 SPIKE SOL (ADAPTER) ×8 IMPLANT
ANCHOR 2.3 SP SGL 1.2 XBRAID (Anchor) ×1 IMPLANT
ANCHOR 2.3MM SP SGL 1.2 XBRAID (Anchor) ×1 IMPLANT
ANCHOR SUT BIO SW 4.75X19.1 (Anchor) ×2 IMPLANT
ANCHOR SUT BIOCOMP LK 2.9X12.5 (Anchor) ×2 IMPLANT
BNDG ADH 2 X3.75 FABRIC TAN LF (GAUZE/BANDAGES/DRESSINGS) ×4 IMPLANT
BUR BR 5.5 12 FLUTE (BURR) ×4 IMPLANT
BUR RADIUS 4.0X18.5 (BURR) ×4 IMPLANT
CANNULA 5.75X7CM (CANNULA)
CANNULA PART THRD DISP 5.75X7 (CANNULA) IMPLANT
CANNULA PARTIAL THREAD 2X7 (CANNULA) ×2 IMPLANT
CANNULA TWIST IN 8.25X9CM (CANNULA) IMPLANT
CHLORAPREP W/TINT 26 (MISCELLANEOUS) ×4 IMPLANT
COOLER POLAR GLACIER W/PUMP (MISCELLANEOUS) ×4 IMPLANT
COVER WAND RF STERILE (DRAPES) ×4 IMPLANT
CRADLE LAMINECT ARM (MISCELLANEOUS) ×4 IMPLANT
DERMABOND ADVANCED (GAUZE/BANDAGES/DRESSINGS)
DERMABOND ADVANCED .7 DNX12 (GAUZE/BANDAGES/DRESSINGS) IMPLANT
DRAPE 3/4 80X56 (DRAPES) ×4 IMPLANT
DRAPE IMP U-DRAPE 54X76 (DRAPES) ×8 IMPLANT
DRAPE INCISE IOBAN 66X45 STRL (DRAPES) ×4 IMPLANT
DRAPE U-SHAPE 47X51 STRL (DRAPES) ×8 IMPLANT
DRSG TEGADERM 4X4.75 (GAUZE/BANDAGES/DRESSINGS) ×12 IMPLANT
ELECT REM PT RETURN 9FT ADLT (ELECTROSURGICAL) ×4
ELECTRODE REM PT RTRN 9FT ADLT (ELECTROSURGICAL) ×2 IMPLANT
GAUZE SPONGE 4X4 12PLY STRL (GAUZE/BANDAGES/DRESSINGS) ×4 IMPLANT
GAUZE XEROFORM 1X8 LF (GAUZE/BANDAGES/DRESSINGS) ×4 IMPLANT
GLOVE BIO SURGEON STRL SZ7.5 (GLOVE) ×4 IMPLANT
GLOVE BIOGEL PI IND STRL 8 (GLOVE) ×4 IMPLANT
GLOVE BIOGEL PI INDICATOR 8 (GLOVE) ×4
GLOVE SURG ORTHO 8.0 STRL STRW (GLOVE) ×4 IMPLANT
GLOVE SURG SYN 8.0 (GLOVE) ×4 IMPLANT
GLOVE SURG SYN 8.0 PF PI (GLOVE) ×2 IMPLANT
GOWN STRL REUS W/ TWL LRG LVL3 (GOWN DISPOSABLE) ×4 IMPLANT
GOWN STRL REUS W/TWL LRG LVL3 (GOWN DISPOSABLE) ×4
GOWN STRL REUS W/TWL XL LVL4 (GOWN DISPOSABLE) ×4 IMPLANT
IV LACTATED RINGER IRRG 3000ML (IV SOLUTION) ×28
IV LR IRRIG 3000ML ARTHROMATIC (IV SOLUTION) ×8 IMPLANT
KIT INSERTION 2.9 PUSHLOCK (KITS) ×2 IMPLANT
KIT STABILIZATION SHOULDER (MISCELLANEOUS) ×4 IMPLANT
KIT SUTURETAK 3.0 INSERT PERC (KITS) IMPLANT
KIT TURNOVER KIT A (KITS) ×4 IMPLANT
MANIFOLD NEPTUNE II (INSTRUMENTS) ×4 IMPLANT
MASK FACE SPIDER DISP (MASK) ×4 IMPLANT
MAT ABSORB  FLUID 56X50 GRAY (MISCELLANEOUS) ×4
MAT ABSORB FLUID 56X50 GRAY (MISCELLANEOUS) ×4 IMPLANT
NDL MAYO CATGUT SZ5 (NEEDLE)
NDL SAFETY ECLIPSE 18X1.5 (NEEDLE) ×2 IMPLANT
NDL SCORPION MULTI FIRE (NEEDLE) IMPLANT
NDL SUT 5 .5 CRC TPR PNT MAYO (NEEDLE) IMPLANT
NEEDLE HYPO 18GX1.5 SHARP (NEEDLE) ×2
NEEDLE SCORPION MULTI FIRE (NEEDLE) ×4 IMPLANT
PACK SHDR ARTHRO (MISCELLANEOUS) ×4 IMPLANT
PAD WRAPON POLAR SHDR XLG (MISCELLANEOUS) ×2 IMPLANT
PENCIL SMOKE ULTRAEVAC 22 CON (MISCELLANEOUS) ×4 IMPLANT
SET TUBE SUCT SHAVER OUTFL 24K (TUBING) ×4 IMPLANT
SET TUBE TIP INTRA-ARTICULAR (MISCELLANEOUS) ×4 IMPLANT
SLING ULTRA II M (MISCELLANEOUS) ×2 IMPLANT
STAPLER SKIN PROX 35W (STAPLE) IMPLANT
STRAP SAFETY 5IN WIDE (MISCELLANEOUS) ×4 IMPLANT
SUT ETHILON 3-0 (SUTURE) ×4 IMPLANT
SUT LASSO 90 DEG CVD (SUTURE) IMPLANT
SUT LASSO 90 DEG SD STR (SUTURE) IMPLANT
SUT MNCRL 4-0 (SUTURE)
SUT MNCRL 4-0 27XMFL (SUTURE)
SUT PROLENE 0 CT 2 (SUTURE) IMPLANT
SUT VIC AB 0 CT1 36 (SUTURE) IMPLANT
SUT VIC AB 2-0 CT2 27 (SUTURE) IMPLANT
SUTURE MNCRL 4-0 27XMF (SUTURE) IMPLANT
SUTURE TAPE FIBERLINK 1.3 LOOP (SUTURE) IMPLANT
SUTURETAPE FIBERLINK 1.3 LOOP (SUTURE) ×4
TAPE CLOTH 3X10 WHT NS LF (GAUZE/BANDAGES/DRESSINGS) ×4 IMPLANT
TAPE MICROFOAM 4IN (TAPE) ×2 IMPLANT
TUBING ARTHRO INFLOW-ONLY STRL (TUBING) ×4 IMPLANT
TUBING CONNECTING 10 (TUBING) IMPLANT
TUBING CONNECTING 10' (TUBING)
WAND WEREWOLF FLOW 90D (MISCELLANEOUS) ×4 IMPLANT
WRAPON POLAR PAD SHDR XLG (MISCELLANEOUS) ×4

## 2019-11-18 NOTE — Anesthesia Preprocedure Evaluation (Signed)
Anesthesia Evaluation  Patient identified by MRN, date of birth, ID band Patient awake    Reviewed: Allergy & Precautions, H&P , NPO status , Patient's Chart, lab work & pertinent test results  History of Anesthesia Complications Negative for: history of anesthetic complications  Airway Mallampati: II  TM Distance: <3 FB Neck ROM: full    Dental  (+) Chipped   Pulmonary neg pulmonary ROS, neg shortness of breath,    Pulmonary exam normal        Cardiovascular Exercise Tolerance: Good hypertension, (-) angina(-) Past MI and (-) DOE Normal cardiovascular exam     Neuro/Psych PSYCHIATRIC DISORDERS negative neurological ROS     GI/Hepatic Neg liver ROS, GERD  Medicated and Controlled,  Endo/Other  negative endocrine ROS  Renal/GU      Musculoskeletal  (+) Arthritis ,   Abdominal   Peds  Hematology negative hematology ROS (+)   Anesthesia Other Findings Past Medical History: No date: Anxiety No date: Arthritis No date: Depression No date: GERD (gastroesophageal reflux disease) No date: GERD (gastroesophageal reflux disease) No date: Hypertension  Past Surgical History: No date: ABDOMINAL HYSTERECTOMY 1991: CESAREAN SECTION No date: CHOLECYSTECTOMY  BMI    Body Mass Index: 41.35 kg/m      Reproductive/Obstetrics negative OB ROS                             Anesthesia Physical Anesthesia Plan  ASA: III  Anesthesia Plan: General ETT   Post-op Pain Management: GA combined w/ Regional for post-op pain   Induction: Intravenous  PONV Risk Score and Plan: Ondansetron, Dexamethasone, Midazolam and Treatment may vary due to age or medical condition  Airway Management Planned: Oral ETT  Additional Equipment:   Intra-op Plan:   Post-operative Plan: Extubation in OR  Informed Consent: I have reviewed the patients History and Physical, chart, labs and discussed the procedure  including the risks, benefits and alternatives for the proposed anesthesia with the patient or authorized representative who has indicated his/her understanding and acceptance.     Dental Advisory Given  Plan Discussed with: Anesthesiologist, CRNA and Surgeon  Anesthesia Plan Comments: (Patient consented for risks of anesthesia including but not limited to:  - adverse reactions to medications - damage to eyes, teeth, lips or other oral mucosa - nerve damage due to positioning  - sore throat or hoarseness - Damage to heart, brain, nerves, lungs, other parts of body or loss of life  Patient voiced understanding.)        Anesthesia Quick Evaluation

## 2019-11-18 NOTE — Transfer of Care (Signed)
Immediate Anesthesia Transfer of Care Note  Patient: Monica Preston  Procedure(s) Performed: RNFA Left shoulder arthroscopy, mini-open rotator cuff repair vs Regeneten patch, biceps tenodesis, distal clavicle excision, subacromial decompression (Left Shoulder)  Patient Location: PACU  Anesthesia Type:General  Level of Consciousness: awake, drowsy and patient cooperative  Airway & Oxygen Therapy: Patient Spontanous Breathing and Patient connected to face mask oxygen  Post-op Assessment: Report given to RN and Post -op Vital signs reviewed and stable  Post vital signs: Reviewed and stable  Last Vitals:  Vitals Value Taken Time  BP 132/73 11/18/19 1021  Temp    Pulse 81 11/18/19 1024  Resp 24 11/18/19 1024  SpO2 95 % 11/18/19 1024  Vitals shown include unvalidated device data.  Last Pain:  Vitals:   11/18/19 0634  TempSrc: Tympanic  PainSc: 0-No pain         Complications: No apparent anesthesia complications

## 2019-11-18 NOTE — H&P (Signed)
Paper H&P to be scanned into permanent record. H&P reviewed. No significant changes noted.  

## 2019-11-18 NOTE — Op Note (Addendum)
SURGERY DATE: 11/18/2019  PRE-OP DIAGNOSIS:  1. Left subacromial impingement 2. Left biceps tendinopathy 3. Left high-grade partial thickness supraspinatus tear 4. Left acromioclavicular joint osteoarthritis  POST-OP DIAGNOSIS: 1. Left subacromial impingement 2. Left biceps tendinopathy 3. Left high-grade partial thickness supraspinatus tear 4. Left acromioclavicular joint osteoarthritis  PROCEDURES:  1. Left arthroscopic rotator cuff repair 2. Left arthroscopic biceps tenodesis 3. Left arthroscopic subacromial decompression 4. Left arthroscopic extensive debridement of shoulder (glenohumeral and subacromial spaces) 5. Left arthroscopic distal clavicle excision  SURGEON: Rosealee Albee, MD  ASSISTANT:  Danford Bad, RNFA  ANESTHESIA: Gen with Exparil interscalene block  ESTIMATED BLOOD LOSS: 5cc  DRAINS:  none  TOTAL IV FLUIDS: per anesthesia   SPECIMENS: none  IMPLANTS:  - Arthrex 2.38mm PushLock Anchor x 1  - Arthrex 4.102mm SwiveLock Anchor x 1 - Iconix SPEED double loaded with 1.2 and 2.20mm tape x 1    OPERATIVE FINDINGS:  Examination under anesthesia: A careful examination under anesthesia was performed.  Passive range of motion was: FF: 160; ER at side: 45; ER in abduction: 90; IR in abduction: 60.  Anterior load shift: NT.  Posterior load shift: NT.  Sulcus in neutral: NT.  Sulcus in ER: NT.    Intra-operative findings: A thorough arthroscopic examination of the shoulder was performed.  The findings are: 1. Biceps tendon: tendinopathy with significant erythema  2. Superior labrum: injected with surrounding synovitis 3. Posterior labrum and capsule: normal 4. Inferior capsule and inferior recess: normal 5. Glenoid cartilage surface: Normal 6. Supraspinatus attachment: high-grade partial thickness tear of anterior supraspinatus with articular sided fraying and very thin remnant of tissue on the bursal side 7. Posterior rotator cuff attachment: normal 8.  Humeral head articular cartilage: normal 9. Rotator interval: significant synovitis 10: Subscapularis tendon: attachment intact 11. Anterior labrum: Mildly degenerative 12. IGHL: normal  OPERATIVE REPORT:   Indications for procedure: Monica Preston is a 54 y.o. female with over 1 year of L shoulder pain.  She has failed non-operative management including activity modification, physical therapy, medical management and corticosteroid injections without long-lasting relief of symptoms. Clinical exam and MRI were suggestive of high-grade partial-thickness rotator cuff tear of the anterior supraspinatus, biceps tendinopathy, subacromial impingement, and acromioclavicular joint arthritis. After discussion of risks, benefits, and alternatives to surgery, the patient elected to proceed.   Procedure in detail:  I identified VANCE BELCOURT in the pre-operative holding area.  I marked the operative shoulder with my initials. I reviewed the risks and benefits of the proposed surgical intervention, and the patient (and/or patient's guardian) wished to proceed.  Anesthesia was then performed with an Exparil interscalene block.  The patient was transferred to the operative suite and placed in the beach chair position.    Appropriate IV antibiotics were administered prior to incision. The operative upper extremity was then prepped and draped in standard fashion. A time out was performed confirming the correct extremity, correct patient, and correct procedure.   I then created a standard posterior portal with an 11 blade. The glenohumeral joint was easily entered with a blunt trocar and the arthroscope introduced. The findings of diagnostic arthroscopy are described above. I debrided degenerative tissue including the synovitic tissue about the rotator interval and anterior and superior labrum. I then coagulated the inflamed synovium to obtain hemostasis and reduce the risk of post-operative swelling using an  Arthrocare radiofrequency device.   Next, the arthroscope was then introduced into the subacromial space. A direct lateral portal was created with  an 11-blade after spinal needle localization. An extensive subacromial bursectomy was performed using a combination of the shaver and Arthrocare wand. The entire acromial undersurface was exposed and the CA ligament was subperiosteally elevated to expose the anterior acromial hook. A burr was used to create a flat anterior and lateral aspect of the acromion, converting it from a Type 2 to a Type 1 acromion. Care was made to keep the deltoid fascia intact.  I then turned my attention to the arthroscopic distal clavicle excision. I identified the acromioclavicular joint. Surrounding bursal tissue was debrided and the edges of the joint were identified. I used the 5.71mm barrel burr to remove the distal clavicle parallel to the edge of the acromion. I was able to fit two widths of the burr into the space between the distal clavicle and acromion, signifying that I had removed ~58mm of distal clavicle. This was confirmed by viewing anteriorly and introducing a probe with measuring marks from the lateral portal. Hemostasis was achieved with an Arthrocare wand.  The region of the partial-thickness tear of the anterior supraspinatus was identified.  There was a very thin film of tissue over the anterior supraspinatus on the bursal side.  I was able to easily push a probe through this tissue all the way into the glenohumeral joint.  The edges of this tissue were debrided with an oscillating shaver, resulting in a small U-shaped tear of the anterior supraspinatus.  Next I created an accessory posterolateral portal to assist with visualization and instrumentation. I prepared the footprint using a burr to expose bleeding bone.   I then percutaneously placed 1 Iconix SPEED medial row anchor. This was placed at the central portion of the tear at the articular margin. I then  shuttled all 4 strands of tape through the rotator cuff just lateral to the musculotendinous junction using a scorpion suture passer.  All 4 strands of the suture were passed through the SwiveLock anchor for the lateral row fixation.  Sutures were tensioned appropriately and this achieved excellent compression and reduction of the rotator cuff tear over its footprint.  This construct was stable to external and internal rotation.  I then reentered the glenohumeral joint, turning my attention to the arthroscopic biceps tenodesis.  I used the loop n tack technique to pass a fiber tape through the biceps in a locked fashion adjacent to the biceps anchor.  A hole for a 2.9 mm Arthrex PushLock was drilled in the bicipital groove just superior to the subscapularis tendon insertion.  The biceps tendon was then cut.  The fiber tape was loaded onto the push lock anchor and impacted into place into the previously drilled hole in the bicipital groove.  This appropriately secured the biceps into the bicipital groove and took it off of tension.  Fluid was evacuated from the shoulder, and the portals were closed with 3-0 Nylon. Xeroform was applied to the portals. A sterile dressing was applied, followed by a Polar Care sleeve and a SlingShot shoulder immobilizer/sling. The patient was awakened from anesthesia without difficulty and was transferred to the PACU in stable condition.   Of note, assistance from a PA was essential to performing the surgery.  PA was present for the entire surgery.  PA assisted with patient positioning, instrumentation, and wound closure. The surgery would have been more difficult and had longer operative time without PA assistance.    COMPLICATIONS: none  DISPOSITION: plan for discharge home after recovery in PACU   POSTOPERATIVE PLAN: Remain in  sling (except hygiene and elbow/wrist/hand RoM exercises as instructed by PT) x 4 weeks and NWB for this time. PT to begin 3-4 days after surgery.   Small/medium rotator cuff repair rehab protocol. ASA 325mg  daily x 2 weeks for DVT ppx.

## 2019-11-18 NOTE — Anesthesia Procedure Notes (Signed)
Anesthesia Regional Block: Interscalene brachial plexus block   Pre-Anesthetic Checklist: ,, timeout performed, Correct Patient, Correct Site, Correct Laterality, Correct Procedure, Correct Position, site marked, Risks and benefits discussed,  Surgical consent,  Pre-op evaluation,  At surgeon's request and post-op pain management  Laterality: Upper and Left  Prep: chloraprep       Needles:  Injection technique: Single-shot  Needle Type: Stimiplex     Needle Length: 5cm  Needle Gauge: 22     Additional Needles:   Procedures:,,,, ultrasound used (permanent image in chart),,,,  Narrative:  Start time: 11/18/2019 7:30 AM End time: 11/18/2019 7:33 AM Injection made incrementally with aspirations every 5 mL.  Performed by: Personally  Anesthesiologist: Alfrieda Tarry, Cleda Mccreedy, MD  Additional Notes: Patient consented for risk and benefits of nerve block including but not limited to nerve damage, failed block, bleeding and infection.  Patient voiced understanding.  Functioning IV was confirmed and monitors were applied.  Timeout done prior to procedure and prior to any sedation being given to the patient.  Patient confirmed procedure site prior to any sedation given to the patient.  A 81mm 22ga Stimuplex needle was used. Sterile prep,hand hygiene and sterile gloves were used.  Minimal sedation used for procedure.  No paresthesia endorsed by patient during the procedure.  Negative aspiration and negative test dose prior to incremental administration of local anesthetic. The patient tolerated the procedure well with no immediate complications.

## 2019-11-18 NOTE — Discharge Instructions (Addendum)
Post-Op Instructions - Rotator Cuff Repair  1. Bracing: You will wear a shoulder immobilizer or sling for 4 weeks.   2. Driving: No driving for at least 2 weeks post-op. When driving, do not wear the immobilizer. Ideally, we recommend no driving for 4 weeks while sling is in place as one arm will be immobilized.   3. Activity: No active lifting for 2 months. Wrist, hand, and elbow motion only. Avoid lifting the upper arm away from the body except for hygiene. You are permitted to bend and straighten the elbow passively only (no active elbow motion). You may use your hand and wrist for typing, writing, and managing utensils (cutting food). Do not lift more than a coffee cup for 8 weeks.  When sleeping or resting, inclined positions (recliner chair or wedge pillow) and a pillow under the forearm for support may provide better comfort for up to 4 weeks.  Avoid long distance travel for 4 weeks.  Return to normal activities after rotator cuff repair repair normally takes 6 months on average. If rehab goes very well, may be able to do most activities at 4 months, except overhead or contact sports.  4. Physical Therapy: Begins 3-4 days after surgery, and proceed 1 time per week for the first 6 weeks, then 1-2 times per week from weeks 6-20 post-op.  5. Medications:  - You will be provided a prescription for narcotic pain medicine. After surgery, take 1-2 narcotic tablets every 4 hours if needed for severe pain.  - A prescription for anti-nausea medication will be provided in case the narcotic medicine causes nausea - take 1 tablet every 6 hours only if nauseated.   - Take tylenol 1000 mg (2 Extra Strength tablets or 3 regular strength) every 8 hours for pain.  May decrease or stop tylenol 5 days after surgery if you are having minimal pain. - Take ASA 325mg /day x 2 weeks to help prevent DVTs/PEs (blood clots).  - DO NOT take ANY nonsteroidal anti-inflammatory pain medications (Advil, Motrin, Ibuprofen,  Aleve, Naproxen, or Naprosyn). These medicines can inhibit healing of your shoulder repair.    If you are taking prescription medication for anxiety, depression, insomnia, muscle spasm, chronic pain, or for attention deficit disorder, you are advised that you are at a higher risk of adverse effects with use of narcotics post-op, including narcotic addiction/dependence, depressed breathing, death. If you use non-prescribed substances: alcohol, marijuana, cocaine, heroin, methamphetamines, etc., you are at a higher risk of adverse effects with use of narcotics post-op, including narcotic addiction/dependence, depressed breathing, death. You are advised that taking > 50 morphine milligram equivalents (MME) of narcotic pain medication per day results in twice the risk of overdose or death. For your prescription provided: oxycodone 5 mg - taking more than 6 tablets per day would result in > 50 morphine milligram equivalents (MME) of narcotic pain medication. Be advised that we will prescribe narcotics short-term, for acute post-operative pain only - 3 weeks for major operations such as shoulder repair/reconstruction surgeries.     6. Post-Op Appointment:  Your first post-op appointment will be 10-14 days post-op.  7. Work or School: For most, but not all procedures, we advise staying out of work or school for at least 1 to 2 weeks in order to recover from the stress of surgery and to allow time for healing.   If you need a work or school note this can be provided.   8. Smoking: If you are a smoker, you need to  refrain from smoking in the postoperative period. The nicotine in cigarettes will inhibit healing of your shoulder repair and decrease the chance of successful repair. Similarly, nicotine containing products (gum, patches) should be avoided.   Post-operative Brace: Apply and remove the brace you received as you were instructed to at the time of fitting and as described in detail as the brace's  instructions for use indicate.  Wear the brace for the period of time prescribed by your physician.  The brace can be cleaned with soap and water and allowed to air dry only.  Should the brace result in increased pain, decreased feeling (numbness/tingling), increased swelling or an overall worsening of your medical condition, please contact your doctor immediately.  If an emergency situation occurs as a result of wearing the brace after normal business hours, please dial 911 and seek immediate medical attention.  Let your doctor know if you have any further questions about the brace issued to you. Refer to the shoulder sling instructions for use if you have any questions regarding the correct fit of your shoulder sling.  [facility] Customer Care for Troubleshooting: [phone]  Video that illustrates how to properly use a shoulder sling: "Instructions for Proper Use of an Orthopaedic Sling" http://bass.com/            Interscalene Nerve Block with Exparel  1.  For your surgery you have received an Interscalene Nerve Block with Exparel. 2. Nerve Blocks affect many types of nerves, including nerves that control movement, pain and normal sensation.  You may experience feelings such as numbness, tingling, heaviness, weakness or the inability to move your arm or the feeling or sensation that your arm has "fallen asleep". 3. A nerve block with Exparel can last up to 5 days.  Usually the weakness wears off first.  The tingling and heaviness usually wear off next.  Finally you may start to notice pain.  Keep in mind that this may occur in any order.  Once a nerve block starts to wear off it is usually completely gone within 60 minutes. 4. ISNB may cause mild shortness of breath, a hoarse voice, blurry vision, unequal pupils, or drooping of the face on the same side as the nerve block.  These symptoms will usually resolve with the numbness.  Very rarely the procedure itself can  cause mild seizures. 5. If needed, your surgeon will give you a prescription for pain medication.  It will take about 60 minutes for the oral pain medication to become fully effective.  So, it is recommended that you start taking this medication before the nerve block first begins to wear off, or when you first begin to feel discomfort. 6. Take your pain medication only as prescribed.  Pain medication can cause sedation and decrease your breathing if you take more than you need for the level of pain that you have. 7. Nausea is a common side effect of many pain medications.  You may want to eat something before taking your pain medicine to prevent nausea. 8. After an Interscalene nerve block, you cannot feel pain, pressure or extremes in temperature in the effected arm.  Because your arm is numb it is at an increased risk for injury.  To decrease the possibility of injury, please practice the following:  a. While you are awake change the position of your arm frequently to prevent too much pressure on any one area for prolonged periods of time. b.  If you have a cast or tight dressing, check  the color or your fingers every couple of hours.  Call your surgeon with the appearance of any discoloration (white or blue). c. If you are given a sling to wear before you go home, please wear it  at all times until the block has completely worn off.  Do not get up at night without your sling. d. Please contact [facility] or your surgeon if you do not begin to regain sensation after 7 days from the surgery.  Anesthesia may be contacted by calling the Same Day Surgery Department, Mon. through Fri., 6 am to 4 pm at [phone].   e. If you experience any other problems or concerns, please contact your surgeon's office. f. If you experience severe or prolonged shortness of breath go to the nearest emergency department.  AMBULATORY SURGERY  DISCHARGE INSTRUCTIONS   1) The drugs that you were given will stay in  your system until tomorrow so for the next 24 hours you should not:  A) Drive an automobile B) Make any legal decisions C) Drink any alcoholic beverage   2) You may resume regular meals tomorrow.  Today it is better to start with liquids and gradually work up to solid foods.  You may eat anything you prefer, but it is better to start with liquids, then soup and crackers, and gradually work up to solid foods.   3) Please notify your doctor immediately if you have any unusual bleeding, trouble breathing, redness and pain at the surgery site, drainage, fever, or pain not relieved by medication.    4) Additional Instructions:        Please contact your physician with any problems or Same Day Surgery at [phone], Monday through Friday 6 am to 4 pm, or [place] [facility] number at [phone].

## 2019-11-18 NOTE — Anesthesia Postprocedure Evaluation (Signed)
Anesthesia Post Note  Patient: Monica Preston  Procedure(s) Performed: SHOULDER ARTHROSCOPY WITH SUBACROMIAL DECOMPRESSION AND DISTAL CLAVICLE EXCISION SHOULDER ARTHROSCOPY WITH ROTATOR CUFF REPAIR AND BICEPS TENODESIS  Patient location during evaluation: PACU Anesthesia Type: General Level of consciousness: awake and alert Pain management: pain level controlled Vital Signs Assessment: post-procedure vital signs reviewed and stable Respiratory status: spontaneous breathing, nonlabored ventilation, respiratory function stable and patient connected to nasal cannula oxygen Cardiovascular status: blood pressure returned to baseline and stable Postop Assessment: no apparent nausea or vomiting Anesthetic complications: no     Last Vitals:  Vitals:   11/18/19 1118 11/18/19 1125  BP: 115/77 119/62  Pulse:  87  Resp:  12  Temp: 36.6 C (!) 36.2 C  SpO2: 94% 94%    Last Pain:  Vitals:   11/18/19 1125  TempSrc: Temporal  PainSc: 0-No pain                 Cleda Mccreedy Hopie Pellegrin

## 2019-11-18 NOTE — Anesthesia Procedure Notes (Signed)
Procedure Name: Intubation Performed by: Fletcher-Harrison, Katlynn Naser, CRNA Pre-anesthesia Checklist: Patient identified, Emergency Drugs available, Suction available and Patient being monitored Patient Re-evaluated:Patient Re-evaluated prior to induction Oxygen Delivery Method: Circle system utilized Preoxygenation: Pre-oxygenation with 100% oxygen Induction Type: IV induction Ventilation: Mask ventilation without difficulty Laryngoscope Size: McGraph and 3 Grade View: Grade I Tube type: Oral Tube size: 7.0 mm Number of attempts: 1 Airway Equipment and Method: Stylet and Oral airway Placement Confirmation: ETT inserted through vocal cords under direct vision,  positive ETCO2,  breath sounds checked- equal and bilateral and CO2 detector Secured at: 21 cm Tube secured with: Tape Dental Injury: Teeth and Oropharynx as per pre-operative assessment        

## 2020-06-23 ENCOUNTER — Ambulatory Visit
Admission: RE | Admit: 2020-06-23 | Discharge: 2020-06-23 | Disposition: A | Discharge status: Home / Self Care | Payer: BC Managed Care – PPO | Source: Ambulatory Visit | Attending: Family Medicine | Admitting: Family Medicine

## 2020-06-23 ENCOUNTER — Other Ambulatory Visit: Payer: Self-pay | Admitting: Family Medicine

## 2020-06-23 ENCOUNTER — Other Ambulatory Visit: Payer: Self-pay

## 2020-06-23 ENCOUNTER — Ambulatory Visit
Admission: RE | Admit: 2020-06-23 | Discharge: 2020-06-23 | Disposition: A | Discharge status: Home / Self Care | Payer: BC Managed Care – PPO | Attending: Family Medicine | Admitting: Family Medicine

## 2020-06-23 DIAGNOSIS — M5416 Radiculopathy, lumbar region: Secondary | ICD-10-CM

## 2020-07-02 ENCOUNTER — Other Ambulatory Visit: Payer: BC Managed Care – PPO

## 2020-07-02 DIAGNOSIS — Z20822 Contact with and (suspected) exposure to covid-19: Secondary | ICD-10-CM

## 2020-07-03 LAB — SARS-COV-2, NAA 2 DAY TAT

## 2020-07-03 LAB — NOVEL CORONAVIRUS, NAA: SARS-CoV-2, NAA: NOT DETECTED

## 2020-09-25 ENCOUNTER — Ambulatory Visit
Admission: EM | Admit: 2020-09-25 | Discharge: 2020-09-25 | Disposition: A | Discharge status: Home / Self Care | Payer: BC Managed Care – PPO | Attending: Sports Medicine | Admitting: Sports Medicine

## 2020-09-25 ENCOUNTER — Encounter: Payer: Self-pay | Admitting: Emergency Medicine

## 2020-09-25 ENCOUNTER — Other Ambulatory Visit: Payer: Self-pay

## 2020-09-25 DIAGNOSIS — R42 Dizziness and giddiness: Secondary | ICD-10-CM | POA: Diagnosis not present

## 2020-09-25 MED ORDER — MECLIZINE HCL 25 MG PO TABS: 25.0000 mg | ORAL_TABLET | Freq: Three times a day (TID) | ORAL | 0 refills | Status: AC | PRN

## 2020-09-25 NOTE — ED Triage Notes (Signed)
Pt c/o dizziness. Started this morning. She took meclizine this morning and was better after taking meclizine. She states she felt like the room was spinning this morning but now she just feels of balance.

## 2020-09-25 NOTE — Discharge Instructions (Signed)
Please see educational handouts 

## 2020-09-25 NOTE — ED Provider Notes (Signed)
[facility]    CSN: [id number] Arrival date & time: 09/25/20  1017      History   Chief Complaint Chief Complaint  Patient presents with  . Dizziness    HPI Monica Preston is a 55 y.o. female.   Patient is a pleasant 55 year old female who presents for evaluation of the above issue.  She reports that she woke up this morning and was dizzy.  She felt the room was spinning.  She took some meclizine that she was prescribed back in 2017 for similar symptoms and went back to sleep.  After few hours she woke up and she said her symptoms had improved but she is not back to her baseline.  Just not as bad.  No chest pain or shortness of breath.  No fever shakes chills.  No headache.  No painful, double, or blurry vision.  No abdominal or urinary symptoms.  No nausea vomiting or diarrhea.  No neck pain numbness or tingling.  No sore throat or upper respiratory symptoms.  No recent COVID exposure or COVID history.  No red flag signs or symptoms elicited on history.     Past Medical History:  Diagnosis Date  . Anxiety   . Arthritis   . Depression   . GERD (gastroesophageal reflux disease)   . GERD (gastroesophageal reflux disease)   . Hypertension     There are no problems to display for this patient.   Past Surgical History:  Procedure Laterality Date  . ABDOMINAL HYSTERECTOMY    . CESAREAN SECTION  1991  . CHOLECYSTECTOMY    . SHOULDER ARTHROSCOPY WITH ROTATOR CUFF REPAIR AND OPEN BICEPS TENODESIS  11/18/2019   Procedure: SHOULDER ARTHROSCOPY WITH ROTATOR CUFF REPAIR AND BICEPS TENODESIS;  Surgeon: Signa Kell, MD;  Location: [facility] ORS;  Service: Orthopedics;;    OB History   No obstetric history on file.      Home Medications    Prior to Admission medications   Medication Sig Start Date End Date Taking? Authorizing Provider  acetaminophen (TYLENOL) 500 MG tablet Take 2 tablets (1,000 mg total) by mouth every 8 (eight) hours. 11/18/19 11/17/20 Yes Signa Kell, MD  buPROPion [facility] SR) 150 MG 12 hr tablet Take 150 mg by mouth daily. 10/04/19  Yes [provider]  Cholecalciferol (SM VITAMIN D3) 100 MCG (4000 UT) CAPS Take 4,000 Units by mouth daily.   Yes [provider]  estradiol (ESTRACE) 1 MG tablet Take 1 mg by mouth daily.   Yes [provider]  famotidine (PEPCID) 40 MG tablet Take 40 mg by mouth at bedtime. 10/02/19  Yes [provider]  Flaxseed, Linseed, (FLAX SEED OIL PO) Take 1,200 mg by mouth daily.   Yes [provider]  lisinopril-hydrochlorothiazide (ZESTORETIC) 20-12.5 MG tablet Take 1 tablet by mouth daily.    Yes [provider]  meclizine (ANTIVERT) 25 MG tablet Take 1 tablet (25 mg total) by mouth 3 (three) times daily as needed for dizziness. 09/25/20  Yes Delton See, MD  pantoprazole (PROTONIX) 40 MG tablet Take 40 mg by mouth 2 (two) times daily. 10/02/19  Yes [provider]  ondansetron (ZOFRAN ODT) 4 MG disintegrating tablet Take 1 tablet (4 mg total) by mouth every 8 (eight) hours as needed for nausea or vomiting. 11/18/19   Signa Kell, MD  oxyCODONE (ROXICODONE) 5 MG immediate release tablet Take 1-2 tablets (5-10 mg total) by mouth every 4 (four) hours as needed (pain). 11/18/19 11/17/20  Allena Katz,  Learta Codding, MD    Family History Family History  Problem Relation Age of Onset  . Hypertension Mother   . Diabetes Mother   . Heart failure Father     Social History Social History   Tobacco Use  . Smoking status: Passive Smoke Exposure - Never Smoker  . Smokeless tobacco: Never Used  Vaping Use  . Vaping Use: Never used  Substance Use Topics  . Alcohol use: No  . Drug use: No     Allergies   Patient has no known allergies.   Review of Systems Review of Systems  Constitutional: Positive for activity change. Negative for appetite change, chills, diaphoresis, fatigue and fever.  HENT: Negative for congestion, ear discharge, ear pain, hearing loss,  sinus pressure, sinus pain and sore throat.   Eyes: Negative.  Negative for photophobia, pain, discharge, redness, itching and visual disturbance.  Respiratory: Negative.  Negative for cough, chest tightness, shortness of breath and wheezing.   Cardiovascular: Negative.  Negative for chest pain and palpitations.  Gastrointestinal: Negative.  Negative for abdominal pain, constipation, diarrhea, nausea and vomiting.  Genitourinary: Negative.  Negative for dysuria.  Musculoskeletal: Negative for back pain, gait problem, myalgias, neck pain and neck stiffness.  Skin: Negative.  Negative for color change, pallor, rash and wound.  Neurological: Positive for dizziness and light-headedness. Negative for tremors, seizures, syncope, speech difficulty, numbness and headaches.  All other systems reviewed and are negative.    Physical Exam Triage Vital Signs ED Triage Vitals  Enc Vitals Group     BP 09/25/20 1104 (!) 151/90     Pulse Rate 09/25/20 1104 71     Resp 09/25/20 1104 18     Temp 09/25/20 1104 97.7 F (36.5 C)     Temp Source 09/25/20 1104 Oral     SpO2 09/25/20 1104 100 %     Weight 09/25/20 1102 279 lb 15.8 oz (127 kg)     Height 09/25/20 1102 5\' 9"  (1.753 m)     Head Circumference --      Peak Flow --      Pain Score 09/25/20 1102 0     Pain Loc --      Pain Edu? --      Excl. in GC? --    No data found.  Updated Vital Signs BP (!) 151/90 (BP Location: Left Arm)   Pulse 71   Temp 97.7 F (36.5 C) (Oral)   Resp 18   Ht 5\' 9"  (1.753 m)   Wt 127 kg   SpO2 100%   BMI 41.35 kg/m   Visual Acuity Right Eye Distance:   Left Eye Distance:   Bilateral Distance:    Right Eye Near:   Left Eye Near:    Bilateral Near:     Physical Exam Vitals and nursing note reviewed.  Constitutional:      General: She is not in acute distress.    Appearance: Normal appearance. She is not ill-appearing, toxic-appearing or diaphoretic.  HENT:     Head: Normocephalic and atraumatic.      Right Ear: Tympanic membrane normal.     Left Ear: Tympanic membrane normal.     Nose: Nose normal. No congestion or rhinorrhea.     Mouth/Throat:     Mouth: Mucous membranes are moist.     Pharynx: No oropharyngeal exudate.  Eyes:     General:        Right eye: No discharge.  Left eye: No discharge.     Extraocular Movements: Extraocular movements intact.     Conjunctiva/sclera: Conjunctivae normal.     Pupils: Pupils are equal, round, and reactive to light.  Cardiovascular:     Rate and Rhythm: Normal rate and regular rhythm.     Pulses: Normal pulses.     Heart sounds: Normal heart sounds. No murmur heard. No friction rub. No gallop.   Pulmonary:     Effort: Pulmonary effort is normal. No respiratory distress.     Breath sounds: Normal breath sounds. No stridor. No wheezing, rhonchi or rales.  Musculoskeletal:     Cervical back: Normal range of motion and neck supple. No rigidity or tenderness.  Lymphadenopathy:     Cervical: No cervical adenopathy.  Skin:    General: Skin is warm and dry.     Capillary Refill: Capillary refill takes less than 2 seconds.     Findings: No bruising, erythema, lesion or rash.  Neurological:     General: No focal deficit present.     Mental Status: She is alert and oriented to person, place, and time.     GCS: GCS eye subscore is 4. GCS verbal subscore is 5. GCS motor subscore is 6.     Cranial Nerves: No cranial nerve deficit.     Sensory: No sensory deficit.     Motor: Motor function is intact. No weakness.     Coordination: Coordination is intact. Coordination normal.     Gait: Gait normal.  Psychiatric:        Mood and Affect: Mood normal.        Behavior: Behavior normal.      UC Treatments / Results  Labs (all labs ordered are listed, but only abnormal results are displayed) Labs Reviewed - No data to display  EKG   Radiology No results found.  Procedures Procedures (including critical care time)  Medications  Ordered in UC Medications - No data to display  Initial Impression / Assessment and Plan / UC Course  I have reviewed the triage vital signs and the nursing notes.  Pertinent labs & imaging results that were available during my care of the patient were reviewed by me and considered in my medical decision making (see chart for details).  Clinical impression: Dizziness since this morning, with a background history of similar symptoms 4 to 5 years ago.  She did respond to meclizine that was out of date.  Is consistent with benign positional vertigo.  She has a reassuring physical exam including a grossly nonfocal neurological exam.  Treatment plan: 1.  The findings and treatment plan were discussed in detail with the patient.  Patient was in agreement. 2.  I recommended going ahead and giving her a new prescription for meclizine.  This was sent to her pharmacy. 3.  Educational handouts provided. 4.  Recommended aggressive hydration with water only.  She should avoid all alcohol and caffeinated products. 5.  We discussed the red flag signs and symptoms and when to seek out immediate medical attention.  This would include going to the emergency room.  She voiced verbal understanding. 6.  She should follow-up with her primary care physician sometime next week for recheck.  Again if she worsens she should go to the ER. 7.  Follow-up here as needed.    Final Clinical Impressions(s) / UC Diagnoses   Final diagnoses:  Vertigo  Dizziness     Discharge Instructions     Please see educational  handouts    ED Prescriptions    Medication Sig Dispense Auth. Provider   meclizine (ANTIVERT) 25 MG tablet Take 1 tablet (25 mg total) by mouth 3 (three) times daily as needed for dizziness. 30 tablet Delton See, MD     PDMP not reviewed this encounter.   Delton See, MD 09/25/20 1228

## 2020-12-29 ENCOUNTER — Other Ambulatory Visit: Payer: Self-pay | Admitting: Family Medicine

## 2020-12-29 DIAGNOSIS — S5011XA Contusion of right forearm, initial encounter: Secondary | ICD-10-CM

## 2020-12-29 DIAGNOSIS — M79631 Pain in right forearm: Secondary | ICD-10-CM

## 2021-01-05 ENCOUNTER — Ambulatory Visit: Payer: BC Managed Care – PPO

## 2021-07-21 ENCOUNTER — Other Ambulatory Visit: Payer: Self-pay | Admitting: Orthopedic Surgery

## 2021-07-21 DIAGNOSIS — M25511 Pain in right shoulder: Secondary | ICD-10-CM

## 2021-07-29 ENCOUNTER — Other Ambulatory Visit: Payer: Self-pay

## 2021-07-29 ENCOUNTER — Ambulatory Visit
Admission: RE | Admit: 2021-07-29 | Discharge: 2021-07-29 | Disposition: A | Discharge status: Home / Self Care | Payer: BC Managed Care – PPO | Source: Ambulatory Visit | Attending: Orthopedic Surgery | Admitting: Orthopedic Surgery

## 2021-07-29 DIAGNOSIS — M25511 Pain in right shoulder: Secondary | ICD-10-CM | POA: Insufficient documentation

## 2021-09-30 ENCOUNTER — Other Ambulatory Visit: Payer: Self-pay | Admitting: Orthopedic Surgery

## 2021-10-20 ENCOUNTER — Inpatient Hospital Stay: Admission: RE | Admit: 2021-10-20 | Payer: BC Managed Care – PPO | Source: Ambulatory Visit

## 2021-10-21 ENCOUNTER — Other Ambulatory Visit: Payer: Self-pay

## 2021-10-21 ENCOUNTER — Encounter
Admission: RE | Admit: 2021-10-21 | Discharge: 2021-10-21 | Disposition: A | Discharge status: Home / Self Care | Payer: BC Managed Care – PPO | Source: Ambulatory Visit | Attending: Orthopedic Surgery | Admitting: Orthopedic Surgery

## 2021-10-21 VITALS — BP 160/98 | HR 89 | Temp 97.8°F | Resp 20 | Ht 68.0 in | Wt 296.0 lb

## 2021-10-21 DIAGNOSIS — Z01818 Encounter for other preprocedural examination: Secondary | ICD-10-CM | POA: Diagnosis present

## 2021-10-21 DIAGNOSIS — Z01810 Encounter for preprocedural cardiovascular examination: Secondary | ICD-10-CM

## 2021-10-21 LAB — CBC WITH DIFFERENTIAL/PLATELET
Abs Immature Granulocytes: 0.07 10*3/uL (ref 0.00–0.07)
Basophils Absolute: 0.1 10*3/uL (ref 0.0–0.1)
Basophils Relative: 1 %
Eosinophils Absolute: 0.2 10*3/uL (ref 0.0–0.5)
Eosinophils Relative: 3 %
HCT: 42.5 % (ref 36.0–46.0)
Hemoglobin: 14.1 g/dL (ref 12.0–15.0)
Immature Granulocytes: 1 %
Lymphocytes Relative: 31 %
Lymphs Abs: 2.4 10*3/uL (ref 0.7–4.0)
MCH: 28 pg (ref 26.0–34.0)
MCHC: 33.2 g/dL (ref 30.0–36.0)
MCV: 84.3 fL (ref 80.0–100.0)
Monocytes Absolute: 0.7 10*3/uL (ref 0.1–1.0)
Monocytes Relative: 10 %
Neutro Abs: 4.2 10*3/uL (ref 1.7–7.7)
Neutrophils Relative %: 54 %
Platelets: 310 10*3/uL (ref 150–400)
RBC: 5.04 MIL/uL (ref 3.87–5.11)
RDW: 13 % (ref 11.5–15.5)
WBC: 7.7 10*3/uL (ref 4.0–10.5)
nRBC: 0 % (ref 0.0–0.2)

## 2021-10-21 LAB — URINALYSIS, ROUTINE W REFLEX MICROSCOPIC
Bilirubin Urine: NEGATIVE
Glucose, UA: NEGATIVE mg/dL
Hgb urine dipstick: NEGATIVE
Ketones, ur: NEGATIVE mg/dL
Leukocytes,Ua: NEGATIVE
Nitrite: NEGATIVE
Protein, ur: NEGATIVE mg/dL
Specific Gravity, Urine: 1.019 (ref 1.005–1.030)
pH: 5 (ref 5.0–8.0)

## 2021-10-21 LAB — COMPREHENSIVE METABOLIC PANEL
ALT: 31 U/L (ref 0–44)
AST: 34 U/L (ref 15–41)
Albumin: 3.8 g/dL (ref 3.5–5.0)
Alkaline Phosphatase: 44 U/L (ref 38–126)
Anion gap: 7 (ref 5–15)
BUN: 18 mg/dL (ref 6–20)
CO2: 29 mmol/L (ref 22–32)
Calcium: 9.4 mg/dL (ref 8.9–10.3)
Chloride: 99 mmol/L (ref 98–111)
Creatinine, Ser: 0.73 mg/dL (ref 0.44–1.00)
GFR, Estimated: >60 mL/min (ref >60)
Glucose, Bld: 99 mg/dL (ref 70–99)
Potassium: 4 mmol/L (ref 3.5–5.1)
Sodium: 135 mmol/L (ref 135–145)
Total Bilirubin: 0.6 mg/dL (ref 0.3–1.2)
Total Protein: 7.1 g/dL (ref 6.5–8.1)

## 2021-10-21 LAB — TYPE AND SCREEN
ABO/RH(D): O POS
Antibody Screen: NEGATIVE

## 2021-10-21 NOTE — Patient Instructions (Addendum)
Your procedure is scheduled on: 10/29/2021  ?Report to the Registration Desk on the 1st floor of the [facility]. ?To find out your arrival time, please call [phone] between 1PM - 3PM on:  10/28/2021  ?If your arrival time is 6:00 am, do not arrive prior to that time as the [facility] entrance doors do not open until 6:00 am. ? ?REMEMBER: ?Instructions that are not followed completely may result in serious medical risk, up to and including death; or upon the discretion of your surgeon and anesthesiologist your surgery may need to be rescheduled. ? ?Do not eat food after midnight the night before surgery.  ?No gum chewing, lozengers or hard candies. ? ?You may however, drink CLEAR liquids up to 2 hours before you are scheduled to arrive for your surgery. Do not drink anything within 2 hours of your scheduled arrival time. ? ?Clear liquids include: ?- water  ?- apple juice without pulp ?- gatorade (not RED colors) ?- black coffee or tea (Do NOT add milk or creamers to the coffee or tea) ?Do NOT drink anything that is not on this list. ? ?In addition, your doctor has ordered for you to drink the provided  ?Ensure Pre-Surgery Clear Carbohydrate Drink  ? ?Drinking this carbohydrate drink up to two hours before surgery helps to reduce insulin resistance and improve patient outcomes. Please complete drinking 2 hours prior to scheduled arrival time. ? ?TAKE THESE MEDICATIONS THE MORNING OF SURGERY WITH A SIP OF WATER: ?buPROPion [facility] SR) ?pantoprazole (PROTONIX) ?SUMAtriptan (IMITREX) ? ? ? ?Follow recommendations from Cardiologist, Pulmonologist or PCP regarding stopping Aspirin, Coumadin, Plavix, Eliquis, Pradaxa, or Pletal.You were instructed by this Pre -admission testing RN to talk to your surgeon regarding stopping aspirin. ? ?One week prior to surgery: ?Stop Anti-inflammatories (NSAIDS) such as Advil, Aleve, Ibuprofen, Motrin, Naproxen, Naprosyn and Aspirin based products such as Excedrin, Goodys  Powder, BC Powder. ?Stop ANY OVER THE COUNTER supplements until after surgery like calcium, flaxseed oil,  Vit D. 3 ?You may however, continue to take Tylenol if needed for pain up until the day of surgery. ? ?No Alcohol for 24 hours before or after surgery. ? ?No Smoking including e-cigarettes for 24 hours prior to surgery.  ?No chewable tobacco products for at least 6 hours prior to surgery.  ?No nicotine patches on the day of surgery. ? ?Do not use any "recreational" drugs for at least a week prior to your surgery.  ?Please be advised that the combination of cocaine and anesthesia may have negative outcomes, up to and including death. ?If you test positive for cocaine, your surgery will be cancelled. ? ?On the morning of surgery brush your teeth with toothpaste and water, you may rinse your mouth with mouthwash if you wish. ?Do not swallow any toothpaste or mouthwash. ? ?Use CHG Soap or wipes as directed on instruction sheet. ? ?Do not wear jewelry, make-up, hairpins, clips or nail polish. ? ?Do not wear lotions, powders, or perfumes.  ? ?Do not shave body from the neck down 48 hours prior to surgery just in case you cut yourself which could leave a site for infection.  ?Also, freshly shaved skin may become irritated if using the CHG soap. ? ?Contact lenses, hearing aids and dentures may not be worn into surgery. ? ?Do not bring valuables to the hospital. [facility] is not responsible for any missing/lost belongings or valuables.  ? ? ?Notify your doctor if there is any change in your medical condition (cold,  fever, infection). ? ?Wear comfortable clothing (specific to your surgery type) to the hospital. ? ?After surgery, you can help prevent lung complications by doing breathing exercises.  ?Take deep breaths and cough every 1-2 hours. Your doctor may order a device called an Incentive Spirometer to help you take deep breaths. ? ?If you are being admitted to the hospital overnight, leave your suitcase in the  car. ?After surgery it may be brought to your room. ? ?If you are being discharged the day of surgery, you will not be allowed to drive home. ?You will need a responsible adult (18 years or older) to drive you home and stay with you that night.  ? ?If you are taking public transportation, you will need to have a responsible adult (18 years or older) with you. ?Please confirm with your physician that it is acceptable to use public transportation.  ? ?Please call the [facility] at [phone] if you have any questions about these instructions. ? ?Surgery Visitation Policy: ? ?Patients undergoing a surgery or procedure may have two family members or support persons with them as long as the person is not COVID-19 positive or experiencing its symptoms.  ? ?

## 2021-10-29 ENCOUNTER — Ambulatory Visit: Payer: BC Managed Care – PPO | Admitting: Registered Nurse

## 2021-10-29 ENCOUNTER — Ambulatory Visit: Payer: BC Managed Care – PPO

## 2021-10-29 ENCOUNTER — Ambulatory Visit
Admission: RE | Admit: 2021-10-29 | Discharge: 2021-10-29 | Disposition: A | Discharge status: Home / Self Care | Payer: BC Managed Care – PPO | Attending: Orthopedic Surgery | Admitting: Orthopedic Surgery

## 2021-10-29 ENCOUNTER — Encounter: Payer: Self-pay | Admitting: Orthopedic Surgery

## 2021-10-29 ENCOUNTER — Encounter
Admission: RE | Disposition: A | Discharge status: Home / Self Care | Payer: Self-pay | Source: Home / Self Care | Attending: Orthopedic Surgery

## 2021-10-29 ENCOUNTER — Other Ambulatory Visit: Payer: Self-pay

## 2021-10-29 ENCOUNTER — Ambulatory Visit: Payer: BC Managed Care – PPO | Admitting: Urgent Care

## 2021-10-29 DIAGNOSIS — Z01818 Encounter for other preprocedural examination: Secondary | ICD-10-CM

## 2021-10-29 DIAGNOSIS — N182 Chronic kidney disease, stage 2 (mild): Secondary | ICD-10-CM | POA: Diagnosis not present

## 2021-10-29 DIAGNOSIS — M25811 Other specified joint disorders, right shoulder: Secondary | ICD-10-CM | POA: Insufficient documentation

## 2021-10-29 DIAGNOSIS — M75101 Unspecified rotator cuff tear or rupture of right shoulder, not specified as traumatic: Secondary | ICD-10-CM | POA: Diagnosis not present

## 2021-10-29 DIAGNOSIS — I129 Hypertensive chronic kidney disease with stage 1 through stage 4 chronic kidney disease, or unspecified chronic kidney disease: Secondary | ICD-10-CM | POA: Diagnosis not present

## 2021-10-29 HISTORY — PX: SHOULDER ARTHROSCOPY WITH SUBACROMIAL DECOMPRESSION AND OPEN ROTATOR C: SHX5688

## 2021-10-29 LAB — ABO/RH: ABO/RH(D): O POS

## 2021-10-29 SURGERY — SHOULDER ARTHROSCOPY WITH SUBACROMIAL DECOMPRESSION AND OPEN ROTATOR CUFF REPAIR, OPEN BICEPS TENDON REPAIR
Anesthesia: Regional | Site: Shoulder | Laterality: Right

## 2021-10-29 MED ORDER — ROCURONIUM BROMIDE 10 MG/ML (PF) SYRINGE
PREFILLED_SYRINGE | INTRAVENOUS | Status: AC
  Filled 2021-10-29: qty 40

## 2021-10-29 MED ORDER — ROCURONIUM BROMIDE 100 MG/10ML IV SOLN
INTRAVENOUS | Status: DC | PRN
  Administered 2021-10-29: 50 mg via INTRAVENOUS
  Administered 2021-10-29 (×3): 30 mg via INTRAVENOUS

## 2021-10-29 MED ORDER — SUGAMMADEX SODIUM 500 MG/5ML IV SOLN
INTRAVENOUS | Status: DC | PRN
  Administered 2021-10-29: 500 mg via INTRAVENOUS

## 2021-10-29 MED ORDER — ACETAMINOPHEN 10 MG/ML IV SOLN
1000.0000 mg | Freq: Once | INTRAVENOUS | Status: DC | PRN
  Administered 2021-10-29: 1000 mg via INTRAVENOUS

## 2021-10-29 MED ORDER — MIDAZOLAM HCL 2 MG/2ML IJ SOLN
1.0000 mg | INTRAMUSCULAR | Status: AC | PRN
  Administered 2021-10-29: 1 mg via INTRAVENOUS

## 2021-10-29 MED ORDER — BUPIVACAINE HCL (PF) 0.5 % IJ SOLN
INTRAMUSCULAR | Status: AC
  Filled 2021-10-29: qty 30

## 2021-10-29 MED ORDER — ACETAMINOPHEN 10 MG/ML IV SOLN
INTRAVENOUS | Status: AC
  Filled 2021-10-29: qty 100

## 2021-10-29 MED ORDER — OXYCODONE HCL 5 MG PO TABS: 5.0000 mg | ORAL_TABLET | ORAL | 0 refills | Status: AC | PRN

## 2021-10-29 MED ORDER — BUPIVACAINE HCL (PF) 0.5 % IJ SOLN
INTRAMUSCULAR | Status: AC
  Filled 2021-10-29: qty 10

## 2021-10-29 MED ORDER — LACTATED RINGERS IV SOLN: INTRAVENOUS | Status: DC

## 2021-10-29 MED ORDER — PROPOFOL 10 MG/ML IV BOLUS
INTRAVENOUS | Status: AC
  Filled 2021-10-29: qty 20

## 2021-10-29 MED ORDER — CEFAZOLIN IN SODIUM CHLORIDE 3-0.9 GM/100ML-% IV SOLN
3.0000 g | INTRAVENOUS | Status: AC
  Administered 2021-10-29: 3 g via INTRAVENOUS
  Filled 2021-10-29: qty 100

## 2021-10-29 MED ORDER — PHENYLEPHRINE 80 MCG/ML (10ML) SYRINGE FOR IV PUSH (FOR BLOOD PRESSURE SUPPORT)
PREFILLED_SYRINGE | INTRAVENOUS | Status: DC | PRN
  Administered 2021-10-29: 80 ug via INTRAVENOUS

## 2021-10-29 MED ORDER — ROCURONIUM BROMIDE 10 MG/ML (PF) SYRINGE
PREFILLED_SYRINGE | INTRAVENOUS | Status: AC
  Filled 2021-10-29: qty 10

## 2021-10-29 MED ORDER — CHLORHEXIDINE GLUCONATE 0.12 % MT SOLN: 15.0000 mL | Freq: Once | OROMUCOSAL | Status: AC

## 2021-10-29 MED ORDER — FENTANYL CITRATE PF 50 MCG/ML IJ SOSY: 50.0000 ug | PREFILLED_SYRINGE | Freq: Once | INTRAMUSCULAR | Status: AC

## 2021-10-29 MED ORDER — EPINEPHRINE PF 1 MG/ML IJ SOLN
INTRAMUSCULAR | Status: DC | PRN
  Administered 2021-10-29: 12004 mL

## 2021-10-29 MED ORDER — CHLORHEXIDINE GLUCONATE 0.12 % MT SOLN
OROMUCOSAL | Status: AC
  Administered 2021-10-29: 15 mL via OROMUCOSAL
  Filled 2021-10-29: qty 15

## 2021-10-29 MED ORDER — FENTANYL CITRATE (PF) 100 MCG/2ML IJ SOLN
INTRAMUSCULAR | Status: DC | PRN
Start: 2021-10-29 — End: 2021-10-29
  Administered 2021-10-29: 25 ug via INTRAVENOUS

## 2021-10-29 MED ORDER — FENTANYL CITRATE (PF) 100 MCG/2ML IJ SOLN
INTRAMUSCULAR | Status: AC
  Filled 2021-10-29: qty 2

## 2021-10-29 MED ORDER — BUPIVACAINE HCL (PF) 0.5 % IJ SOLN
INTRAMUSCULAR | Status: DC | PRN
  Administered 2021-10-29: 10 mL

## 2021-10-29 MED ORDER — PROPOFOL 10 MG/ML IV BOLUS
INTRAVENOUS | Status: DC | PRN
  Administered 2021-10-29: 150 mg via INTRAVENOUS

## 2021-10-29 MED ORDER — BUPIVACAINE LIPOSOME 1.3 % IJ SUSP
INTRAMUSCULAR | Status: AC
  Filled 2021-10-29: qty 20

## 2021-10-29 MED ORDER — OXYCODONE HCL 5 MG/5ML PO SOLN: 5.0000 mg | Freq: Once | ORAL | Status: DC | PRN

## 2021-10-29 MED ORDER — BUPIVACAINE LIPOSOME 1.3 % IJ SUSP
INTRAMUSCULAR | Status: DC | PRN
  Administered 2021-10-29: 20 mL

## 2021-10-29 MED ORDER — FENTANYL CITRATE (PF) 100 MCG/2ML IJ SOLN: 25.0000 ug | INTRAMUSCULAR | Status: DC | PRN

## 2021-10-29 MED ORDER — FENTANYL CITRATE PF 50 MCG/ML IJ SOSY
PREFILLED_SYRINGE | INTRAMUSCULAR | Status: AC
  Administered 2021-10-29: 50 ug via INTRAVENOUS
  Filled 2021-10-29: qty 1

## 2021-10-29 MED ORDER — ONDANSETRON HCL 4 MG/2ML IJ SOLN
4.0000 mg | Freq: Once | INTRAMUSCULAR | Status: AC | PRN
  Administered 2021-10-29: 4 mg via INTRAVENOUS

## 2021-10-29 MED ORDER — ORAL CARE MOUTH RINSE: 15.0000 mL | Freq: Once | OROMUCOSAL | Status: AC

## 2021-10-29 MED ORDER — RINGERS IRRIGATION IR SOLN
Status: DC | PRN
  Administered 2021-10-29 (×2): 3000 mL

## 2021-10-29 MED ORDER — DEXAMETHASONE SODIUM PHOSPHATE 10 MG/ML IJ SOLN
INTRAMUSCULAR | Status: DC | PRN
  Administered 2021-10-29: 10 mg via INTRAVENOUS

## 2021-10-29 MED ORDER — ASPIRIN 325 MG PO TBEC: 325.0000 mg | DELAYED_RELEASE_TABLET | Freq: Every day | ORAL | 0 refills | Status: AC

## 2021-10-29 MED ORDER — MIDAZOLAM HCL 2 MG/2ML IJ SOLN
INTRAMUSCULAR | Status: AC
  Administered 2021-10-29: 1 mg via INTRAVENOUS
  Filled 2021-10-29: qty 2

## 2021-10-29 MED ORDER — ACETAMINOPHEN 500 MG PO TABS: 1000.0000 mg | ORAL_TABLET | Freq: Three times a day (TID) | ORAL | 2 refills | Status: AC

## 2021-10-29 MED ORDER — MIDAZOLAM HCL 2 MG/2ML IJ SOLN
INTRAMUSCULAR | Status: AC
  Filled 2021-10-29: qty 2

## 2021-10-29 MED ORDER — LIDOCAINE HCL (CARDIAC) PF 100 MG/5ML IV SOSY
PREFILLED_SYRINGE | INTRAVENOUS | Status: DC | PRN
Start: 2021-10-29 — End: 2021-10-29
  Administered 2021-10-29: 100 mg via INTRAVENOUS

## 2021-10-29 MED ORDER — EPINEPHRINE PF 1 MG/ML IJ SOLN
INTRAMUSCULAR | Status: AC
Start: 2021-10-29 — End: ?
  Filled 2021-10-29: qty 4

## 2021-10-29 MED ORDER — LIDOCAINE HCL (PF) 2 % IJ SOLN
INTRAMUSCULAR | Status: AC
  Filled 2021-10-29: qty 5

## 2021-10-29 MED ORDER — ONDANSETRON 4 MG PO TBDP
4.0000 mg | ORAL_TABLET | Freq: Three times a day (TID) | ORAL | 0 refills | Status: AC | PRN
Start: 2021-10-29 — End: ?

## 2021-10-29 MED ORDER — ONDANSETRON HCL 4 MG/2ML IJ SOLN
INTRAMUSCULAR | Status: AC
  Filled 2021-10-29: qty 2

## 2021-10-29 MED ORDER — OXYCODONE HCL 5 MG PO TABS: 5.0000 mg | ORAL_TABLET | Freq: Once | ORAL | Status: DC | PRN

## 2021-10-29 SURGICAL SUPPLY — 79 items
ADAPTER IRRIG TUBE 2 SPIKE SOL (ADAPTER) ×3 IMPLANT
ANCHOR 2.3 SP SGL 1.2 XBRAID (Anchor) ×1 IMPLANT
ANCHOR 3.9 PEEK CORKSCREW 5MTS (Anchor) IMPLANT
ANCHOR SUT BIOC ST 3X145 (Anchor) IMPLANT
ANCHOR SWIVELOCK BIO 4.75X19.1 (Anchor) ×2 IMPLANT
BLADE SHAVER 4.5X7 STR FR (MISCELLANEOUS) ×2 IMPLANT
BNDG ADH 2 X3.75 FABRIC TAN LF (GAUZE/BANDAGES/DRESSINGS) ×2 IMPLANT
BUR BR 5.5 WIDE MOUTH (BURR) ×1 IMPLANT
CANNULA PART THRD DISP 5.75X7 (CANNULA) IMPLANT
CANNULA PARTIAL THREAD 2X7 (CANNULA) IMPLANT
CANNULA TWIST IN 8.25X9CM (CANNULA) ×1 IMPLANT
CHLORAPREP W/TINT 26 (MISCELLANEOUS) ×2 IMPLANT
COOLER POLAR GLACIER W/PUMP (MISCELLANEOUS) ×2 IMPLANT
DERMABOND ADVANCED (GAUZE/BANDAGES/DRESSINGS)
DERMABOND ADVANCED .7 DNX12 (GAUZE/BANDAGES/DRESSINGS) IMPLANT
DEVICE SUCT BLK HOLE OR FLOOR (MISCELLANEOUS) ×1 IMPLANT
DRAPE 3/4 80X56 (DRAPES) ×2 IMPLANT
DRAPE INCISE IOBAN 66X45 STRL (DRAPES) ×2 IMPLANT
DRAPE U-SHAPE 47X51 STRL (DRAPES) ×4 IMPLANT
DRSG TEGADERM 4X4.75 (GAUZE/BANDAGES/DRESSINGS) ×6 IMPLANT
ELECT REM PT RETURN 9FT ADLT (ELECTROSURGICAL) ×2
ELECTRODE REM PT RTRN 9FT ADLT (ELECTROSURGICAL) ×1 IMPLANT
GAUZE SPONGE 4X4 12PLY STRL (GAUZE/BANDAGES/DRESSINGS) ×2 IMPLANT
GAUZE XEROFORM 1X8 LF (GAUZE/BANDAGES/DRESSINGS) ×2 IMPLANT
GLOVE BIO SURGEON STRL SZ7.5 (GLOVE) ×2 IMPLANT
GLOVE BIOGEL PI IND STRL 8 (GLOVE) ×2 IMPLANT
GLOVE BIOGEL PI INDICATOR 8 (GLOVE) ×2
GLOVE SURG ORTHO 8.0 STRL STRW (GLOVE) ×2 IMPLANT
GLOVE SURG SYN 8.0 (GLOVE) ×2 IMPLANT
GLOVE SURG SYN 8.0 PF PI (GLOVE) ×1 IMPLANT
GOWN STRL REUS W/ TWL LRG LVL3 (GOWN DISPOSABLE) ×2 IMPLANT
GOWN STRL REUS W/TWL LRG LVL3 (GOWN DISPOSABLE) ×2
GOWN STRL REUS W/TWL XL LVL4 (GOWN DISPOSABLE) ×2 IMPLANT
IV LACTATED RINGER IRRG 3000ML (IV SOLUTION) ×4
IV LR IRRIG 3000ML ARTHROMATIC (IV SOLUTION) ×4 IMPLANT
KIT CORKSCREW KNTLS 3.9 S/T/P (INSTRUMENTS) IMPLANT
KIT STABILIZATION SHOULDER (MISCELLANEOUS) ×2 IMPLANT
KIT SUTURETAK 3.0 INSERT PERC (KITS) IMPLANT
KIT TURNOVER KIT A (KITS) ×2 IMPLANT
MANIFOLD NEPTUNE II (INSTRUMENTS) ×4 IMPLANT
MASK FACE SPIDER DISP (MASK) ×2 IMPLANT
MAT ABSORB  FLUID 56X50 GRAY (MISCELLANEOUS) ×2
MAT ABSORB FLUID 56X50 GRAY (MISCELLANEOUS) ×2 IMPLANT
NDL MAYO CATGUT SZ5 (NEEDLE)
NDL SAFETY ECLIPSE 18X1.5 (NEEDLE) ×1 IMPLANT
NDL SCORPION MULTI FIRE (NEEDLE) IMPLANT
NDL SUT 5 .5 CRC TPR PNT MAYO (NEEDLE) IMPLANT
NEEDLE HYPO 18GX1.5 SHARP (NEEDLE) ×1
NEEDLE SCORPION MULTI FIRE (NEEDLE) IMPLANT
PACK ARTHROSCOPY SHOULDER (MISCELLANEOUS) ×2 IMPLANT
PAD ARMBOARD 7.5X6 YLW CONV (MISCELLANEOUS) ×2 IMPLANT
PAD WRAPON POLAR SHDR XLG (MISCELLANEOUS) ×1 IMPLANT
PASSER SUT FIRSTPASS SELF (INSTRUMENTS) ×2 IMPLANT
PASSER SUT SWIFTSTITCH HIP CRT (INSTRUMENTS) ×2 IMPLANT
SHAVER BLADE BONE CUTTER  5.5 (BLADE) ×1
SHAVER BLADE BONE CUTTER 5.5 (BLADE) ×1 IMPLANT
SLING ULTRA II M (MISCELLANEOUS) ×1 IMPLANT
SPONGE T-LAP 18X18 ~~LOC~~+RFID (SPONGE) ×2 IMPLANT
STAPLER SKIN PROX 35W (STAPLE) IMPLANT
STRAP SAFETY 5IN WIDE (MISCELLANEOUS) ×2 IMPLANT
SUT ETHILON 3-0 (SUTURE) ×2 IMPLANT
SUT LASSO 90 DEG CVD (SUTURE) IMPLANT
SUT LASSO 90 DEG SD STR (SUTURE) IMPLANT
SUT MNCRL 4-0 (SUTURE)
SUT MNCRL 4-0 27XMFL (SUTURE)
SUT PROLENE 0 CT 2 (SUTURE) IMPLANT
SUT VIC AB 0 CT1 36 (SUTURE) IMPLANT
SUT VIC AB 2-0 CT2 27 (SUTURE) IMPLANT
SUTURE MNCRL 4-0 27XMF (SUTURE) IMPLANT
SYSTEM FBRTK BICEPS 1.9 DRILL (Anchor) ×1 IMPLANT
SYSTEM IMPL TENODESIS LNT 2.9 (Orthopedic Implant) ×1 IMPLANT
TAPE CLOTH 3X10 WHT NS LF (GAUZE/BANDAGES/DRESSINGS) ×2 IMPLANT
TAPE MICROFOAM 4IN (TAPE) ×1 IMPLANT
TUBING CONNECTING 10 (TUBING) IMPLANT
TUBING INFLOW SET DBFLO PUMP (TUBING) ×2 IMPLANT
TUBING OUTFLOW SET DBLFO PUMP (TUBING) ×2 IMPLANT
WAND WEREWOLF FLOW 90D (MISCELLANEOUS) ×2 IMPLANT
WATER STERILE IRR 500ML POUR (IV SOLUTION) ×2 IMPLANT
WRAPON POLAR PAD SHDR XLG (MISCELLANEOUS) ×2

## 2021-10-29 NOTE — Anesthesia Postprocedure Evaluation (Signed)
Anesthesia Post Note  Patient: Monica Preston  Procedure(s) Performed: Right shoulder arthroscopic rotator cuff repair, biceps tenodesis and subacromial decompression (Right: Shoulder)  Patient location during evaluation: PACU Anesthesia Type: Regional Level of consciousness: awake and alert, oriented and patient cooperative Pain management: pain level controlled Vital Signs Assessment: post-procedure vital signs reviewed and stable Respiratory status: spontaneous breathing, nonlabored ventilation and respiratory function stable Cardiovascular status: blood pressure returned to baseline and stable Postop Assessment: adequate PO intake Anesthetic complications: no   No notable events documented.   Last Vitals:  Vitals:   10/29/21 1015 10/29/21 1028  BP: (!) 146/86 125/86  Pulse: 70 71  Resp: 16 18  Temp: (!) 36.1 C (!) 36.4 C  SpO2: 93% 94%    Last Pain:  Vitals:   10/29/21 1028  TempSrc: Temporal  PainSc: 0-No pain                 Reed Breech

## 2021-10-29 NOTE — Discharge Instructions (Addendum)
Post-Op Instructions - Rotator Cuff Repair  1. Bracing: You will wear a shoulder immobilizer or sling for 4 weeks.   2. Driving: No driving for 2 weeks post-op. When driving, do not wear the immobilizer. Ideally, we recommend no driving for 4 weeks while sling is in place as one arm will be immobilized.   3. Activity: No active lifting for 2 months. Wrist, hand, and elbow motion only. Avoid lifting the upper arm away from the body except for hygiene. You are permitted to bend and straighten the elbow passively only (no active elbow motion). You may use your hand and wrist for typing, writing, and managing utensils (cutting food). Do not lift more than a coffee cup for 8 weeks.  When sleeping or resting, inclined positions (recliner chair or wedge pillow) and a pillow under the forearm for support may provide better comfort for up to 4 weeks.  Avoid long distance travel for 4 weeks.  Return to normal activities after rotator cuff repair repair normally takes 6 months on average. If rehab goes very well, may be able to do most activities at 4 months, except overhead or contact sports.  4. Physical Therapy: Begins 3-4 days after surgery, and proceed 1 time per week for the first 6 weeks, then 1-2 times per week from weeks 6-20 post-op.  5. Medications:  - You will be provided a prescription for narcotic pain medicine. After surgery, take 1-2 narcotic tablets every 4 hours if needed for severe pain.  - A prescription for anti-nausea medication will be provided in case the narcotic medicine causes nausea - take 1 tablet every 6 hours only if nauseated.   - Take tylenol 1000 mg (2 Extra Strength tablets or 3 regular strength) every 8 hours for pain.  May decrease or stop tylenol 5 days after surgery if you are having minimal pain. - Take ASA '325mg'$ /day x 2 weeks to help prevent DVTs/PEs (blood clots).  - DO NOT take ANY nonsteroidal anti-inflammatory pain medications (Advil, Motrin, Ibuprofen, Aleve,  Naproxen, or Naprosyn). These medicines can inhibit healing of your shoulder repair.    If you are taking prescription medication for anxiety, depression, insomnia, muscle spasm, chronic pain, or for attention deficit disorder, you are advised that you are at a higher risk of adverse effects with use of narcotics post-op, including narcotic addiction/dependence, depressed breathing, death. If you use non-prescribed substances: alcohol, marijuana, cocaine, heroin, methamphetamines, etc., you are at a higher risk of adverse effects with use of narcotics post-op, including narcotic addiction/dependence, depressed breathing, death. You are advised that taking > 50 morphine milligram equivalents (MME) of narcotic pain medication per day results in twice the risk of overdose or death. For your prescription provided: oxycodone 5 mg - taking more than 6 tablets per day would result in > 50 morphine milligram equivalents (MME) of narcotic pain medication. Be advised that we will prescribe narcotics short-term, for acute post-operative pain only - 3 weeks for major operations such as shoulder repair/reconstruction surgeries.     6. Post-Op Appointment:  Your first post-op appointment will be 10-14 days post-op.  7. Work or School: For most, but not all procedures, we advise staying out of work or school for at least 1 to 2 weeks in order to recover from the stress of surgery and to allow time for healing.   If you need a work or school note this can be provided.   8. Smoking: If you are a smoker, you need to refrain from  smoking in the postoperative period. The nicotine in cigarettes will inhibit healing of your shoulder repair and decrease the chance of successful repair. Similarly, nicotine containing products (gum, patches) should be avoided.   Post-operative Brace: Apply and remove the brace you received as you were instructed to at the time of fitting and as described in detail as the brace's  instructions for use indicate.  Wear the brace for the period of time prescribed by your physician.  The brace can be cleaned with soap and water and allowed to air dry only.  Should the brace result in increased pain, decreased feeling (numbness/tingling), increased swelling or an overall worsening of your medical condition, please contact your doctor immediately.  If an emergency situation occurs as a result of wearing the brace after normal business hours, please dial 911 and seek immediate medical attention.  Let your doctor know if you have any further questions about the brace issued to you. Refer to the shoulder sling instructions for use if you have any questions regarding the correct fit of your shoulder sling.  [facility] Customer Care for Troubleshooting: [phone]  Video that illustrates how to properly use a shoulder sling: "Instructions for Proper Use of an Orthopaedic Sling" http://bass.com/  AMBULATORY SURGERY  DISCHARGE INSTRUCTIONS   The drugs that you were given will stay in your system until tomorrow so for the next 24 hours you should not:  Drive an automobile Make any legal decisions Drink any alcoholic beverage   You may resume regular meals tomorrow.  Today it is better to start with liquids and gradually work up to solid foods.  You may eat anything you prefer, but it is better to start with liquids, then soup and crackers, and gradually work up to solid foods.   Please notify your doctor immediately if you have any unusual bleeding, trouble breathing, redness and pain at the surgery site, drainage, fever, or pain not relieved by medication.    Additional Instructions:      Information for Discharge Teaching:  DO NOT REMOVE TEAL EXPAREL BRACELET FOR 4 DAYS; 11/02/2021, (96 hours)  EXPAREL (bupivacaine liposome injectable suspension)   Your surgeon or anesthesiologist gave you EXPAREL(bupivacaine) to help control your pain after  surgery.  EXPAREL is a local anesthetic that provides pain relief by numbing the tissue around the surgical site. EXPAREL is designed to release pain medication over time and can control pain for up to 72 hours. Depending on how you respond to EXPAREL, you may require less pain medication during your recovery.  Possible side effects: Temporary loss of sensation or ability to move in the area where bupivacaine was injected. Nausea, vomiting, constipation Rarely, numbness and tingling in your mouth or lips, lightheadedness, or anxiety may occur. Call your doctor right away if you think you may be experiencing any of these sensations, or if you have other questions regarding possible side effects.  Follow all other discharge instructions given to you by your surgeon or nurse. Eat a healthy diet and drink plenty of water or other fluids.  If you return to the hospital for any reason within 96 hours following the administration of EXPAREL, it is important for health care providers to know that you have received this anesthetic. A teal colored band has been placed on your arm with the date, time and amount of EXPAREL you have received in order to alert and inform your health care providers. Please leave this armband in place for the full 96 hours following administration,  and then you may remove the band.    Please contact your physician with any problems or Same Day Surgery at [phone], Monday through Friday 6 am to 4 pm, or [place] at [facility] number at [phone].

## 2021-10-29 NOTE — H&P (Signed)
Paper H&P to be scanned into permanent record. H&P reviewed. No significant changes noted.  

## 2021-10-29 NOTE — Anesthesia Procedure Notes (Signed)
Procedure Name: Intubation Date/Time: 10/29/2021 7:40 AM Performed by: Hezzie Bump, CRNA Pre-anesthesia Checklist: Patient identified, Patient being monitored, Timeout performed, Emergency Drugs available and Suction available Patient Re-evaluated:Patient Re-evaluated prior to induction Oxygen Delivery Method: Circle system utilized Preoxygenation: Pre-oxygenation with 100% oxygen Induction Type: IV induction Ventilation: Mask ventilation without difficulty Laryngoscope Size: 3 and McGraph Grade View: Grade II Tube type: Oral Tube size: 7.0 mm Number of attempts: 1 Airway Equipment and Method: Stylet and Video-laryngoscopy Placement Confirmation: ETT inserted through vocal cords under direct vision, positive ETCO2 and breath sounds checked- equal and bilateral Secured at: 22 cm Tube secured with: Tape Dental Injury: Teeth and Oropharynx as per pre-operative assessment

## 2021-10-29 NOTE — Op Note (Signed)
SURGERY DATE: 10/29/2021   PRE-OP DIAGNOSIS:  1. Right subacromial impingement 2. Right biceps tendinopathy 3. Right rotator cuff tear   POST-OP DIAGNOSIS: 1. Right subacromial impingement 2. Right biceps tendinopathy 3. Right rotator cuff tear   PROCEDURES:  1. Right arthroscopic rotator cuff repair 2. Right arthroscopic biceps tenodesis 3. Right arthroscopic subacromial decompression 4. Right arthroscopic extensive debridement of shoulder (glenohumeral and subacromial spaces)   SURGEON: Cato Mulligan, MD   ASSISTANT: Cecille Amsterdam, PA-S   ANESTHESIA: Gen with Exparel interscalene block   ESTIMATED BLOOD LOSS: 5cc   DRAINS:  none   TOTAL IV FLUIDS: per anesthesia      SPECIMENS: none   IMPLANTS:  - Arthrex 2.87mm PushLock x 1 - Arthrex 4.15mm SwiveLock x 1 - Iconix SPEED double loaded with 1.2 and 2.26mm tape x 1     OPERATIVE FINDINGS:  Examination under anesthesia: A careful examination under anesthesia was performed.  Passive range of motion was: FF: 150; ER at side: 50; ER in abduction: 90; IR in abduction: 45.  Anterior load shift: NT.  Posterior load shift: NT.  Sulcus in neutral: NT.  Sulcus in ER: NT.     Intra-operative findings: A thorough arthroscopic examination of the shoulder was performed.  The findings are: 1. Biceps tendon: tendinopathy with erythema at the proximal biceps anchor 2. Superior labrum: erythema 3. Posterior labrum and capsule: mild degenerative changes 4. Inferior capsule and inferior recess: normal 5. Glenoid cartilage surface: Normal 6. Supraspinatus attachment: full-thickness tear of the supraspinatus 7. Posterior rotator cuff attachment: normal 8. Humeral head articular cartilage: normal 9. Rotator interval: significant synovitis 10: Subscapularis tendon: attachment intact 11. Anterior labrum: Mildly degenerative 12. IGHL: normal   OPERATIVE REPORT:    Indications for procedure:  RAYMONDE HENDY is a 56 y.o. female with  over 1 year of right shoulder pain.  She has had difficulty with overhead motion, significant pain, and sensations of weakness. Clinical exam and MRI were suggestive of rotator cuff tear, biceps tendinopathy, and subacromial impingement. Patient has failed extensive nonoperative management including medications, activity modifications, corticosteroid injection, and exercises. After discussion of risks, benefits, and alternatives to surgery, the patient elected to proceed.    Procedure in detail:   I identified VIOLETTE HELLMANN in the pre-operative holding area.  I marked the operative shoulder with my initials. I reviewed the risks and benefits of the proposed surgical intervention, and the patient wished to proceed.  Anesthesia was then performed with an Exparel interscalene block.  The patient was transferred to the operative suite and placed in the beach chair position.     Appropriate IV antibiotics were administered prior to incision. The operative upper extremity was then prepped and draped in standard fashion. A time out was performed confirming the correct extremity, correct patient, and correct procedure.    I then created a standard posterior portal with an 11 blade. The glenohumeral joint was easily entered with a blunt trocar and the arthroscope introduced. The findings of diagnostic arthroscopy are described above. I debrided degenerative tissue including the synovitic tissue about the rotator interval and anterior, posterior, and superior labrum. I then coagulated the inflamed synovium to obtain hemostasis and reduce the risk of post-operative swelling using an Arthrocare radiofrequency device.   I then turned my attention to the arthroscopic biceps tenodesis. The Loop n Tack technique was used to pass a FiberTape through the biceps in a locked fashion adjacent to the biceps anchor.  A hole for a  2.9 mm Arthrex PushLock was drilled in the bicipital groove just superior to the subscapularis  tendon insertion.  The biceps tendon was then cut and the biceps anchor complex was debrided down to a stable base on the superior labrum.  The FiberTape was loaded onto the PushLock anchor and impacted into place into the previously drilled hole in the bicipital groove.  This appropriately secured the biceps into the bicipital groove and took it off of tension.   Next, the arthroscope was then introduced into the subacromial space. A direct lateral portal was created with an 11-blade after spinal needle localization. An extensive subacromial bursectomy was performed using a combination of the shaver and Arthrocare wand. The entire acromial undersurface was exposed and the CA ligament was subperiosteally elevated to expose the anterior acromial hook. A burr was used to create a flat anterior and lateral aspect of the acromion, converting it from a Type 2 to a Type 1 acromion. Care was made to keep the deltoid fascia intact.   Next I created an accessory anterolateral portal to assist with visualization and instrumentation.  I debrided the poor quality edges of the supraspinatus tendon.  This was a U-shaped tear of the supraspinatus.  I prepared the footprint using a burr to expose bleeding bone.    I then percutaneously placed 1 Iconix SPEED medial row anchor at the articular margin. I then shuttled all 4 strands of tape through the rotator cuff just lateral to the musculotendinous junction using a FirstPass suture passer spanning the anterior to posterior extent of the tear. All 4 strands of suture were passed through an [employer] anchor.  This was placed approximately 2 cm distal to the lateral edge of the footprint in line with the midportion of the tear with appropriate tensioning of each suture prior to final fixation. There was 1 small dogear one anteriorly.  The knotless mechanism of the SwiveLock anchor was utilized to reduce the dogear. This construct allowed for excellent reapproximation of  the rotator cuff to its native footprint without undue tension.  Appropriate compression was achieved.  The repair was stable to external and internal rotation.   Fluid was evacuated from the shoulder, and the portals were closed with 3-0 Nylon. Xeroform was applied to the portals. A sterile dressing was applied, followed by a Polar Care sleeve and a SlingShot shoulder immobilizer/sling. The patient was awakened from anesthesia without difficulty and was transferred to the PACU in stable condition.    COMPLICATIONS: none   DISPOSITION: plan for discharge home after recovery in PACU   POSTOPERATIVE PLAN: Remain in sling (except hygiene and elbow/wrist/hand RoM exercises as instructed by PT) x 4 weeks and NWB for this time. PT to begin 3-4 days after surgery.  Small/medium rotator cuff repair rehab protocol. ASA 325mg  daily x 2 weeks for DVT ppx.

## 2021-10-29 NOTE — Transfer of Care (Signed)
Immediate Anesthesia Transfer of Care Note  Patient: Monica Preston  Procedure(s) Performed: Right shoulder arthroscopic rotator cuff repair, biceps tenodesis and subacromial decompression (Right: Shoulder)  Patient Location: PACU  Anesthesia Type:General  Level of Consciousness: drowsy  Airway & Oxygen Therapy: Patient Spontanous Breathing and Patient connected to face mask oxygen  Post-op Assessment: Report given to RN and Post -op Vital signs reviewed and stable  Post vital signs: Reviewed and stable  Last Vitals:  Vitals Value Taken Time  BP 152/89 10/29/21 0936  Temp 36.4 C 10/29/21 0936  Pulse 72 10/29/21 0939  Resp 20 10/29/21 0939  SpO2 97 % 10/29/21 0939  Vitals shown include unvalidated device data.  Last Pain:  Vitals:   10/29/21 4163  TempSrc: Temporal  PainSc: 0-No pain         Complications: No notable events documented.

## 2021-10-29 NOTE — Anesthesia Preprocedure Evaluation (Addendum)
Anesthesia Evaluation  Patient identified by MRN, date of birth, ID band Patient awake    Reviewed: Allergy & Precautions, NPO status , Patient's Chart, lab work & pertinent test results  History of Anesthesia Complications Negative for: history of anesthetic complications  Airway Mallampati: II   Neck ROM: Full    Dental no notable dental hx.    Pulmonary neg pulmonary ROS,    Pulmonary exam normal breath sounds clear to auscultation       Cardiovascular hypertension, Normal cardiovascular exam Rhythm:Regular Rate:Normal  ECG 10/21/21: normal  Echo 11/21/16:  NORMAL LEFT VENTRICULAR SYSTOLIC FUNCTION  NORMAL RIGHT VENTRICULAR SYSTOLIC FUNCTION  TRIVIAL REGURGITATION NOTED   NO VALVULAR STENOSIS    Neuro/Psych PSYCHIATRIC DISORDERS Anxiety Depression negative neurological ROS     GI/Hepatic GERD  ,  Endo/Other  Class 3 obesity  Renal/GU Renal disease (stage II CKD)     Musculoskeletal  (+) Arthritis ,   Abdominal   Peds  Hematology negative hematology ROS (+)   Anesthesia Other Findings   Reproductive/Obstetrics                            Anesthesia Physical Anesthesia Plan  ASA: 3  Anesthesia Plan: General and Regional   Post-op Pain Management: Regional block*   Induction: Intravenous  PONV Risk Score and Plan: 3 and Ondansetron, Dexamethasone and Treatment may vary due to age or medical condition  Airway Management Planned: Oral ETT  Additional Equipment:   Intra-op Plan:   Post-operative Plan: Extubation in OR  Informed Consent: I have reviewed the patients History and Physical, chart, labs and discussed the procedure including the risks, benefits and alternatives for the proposed anesthesia with the patient or authorized representative who has indicated his/her understanding and acceptance.     Dental advisory given  Plan Discussed with: CRNA  Anesthesia Plan  Comments: (Plan for preoperative interscalene nerve block and GETA.  Patient consented for risks of anesthesia including but not limited to:  - adverse reactions to medications - damage to eyes, teeth, lips or other oral mucosa - nerve damage due to positioning  - sore throat or hoarseness - damage to heart, brain, nerves, lungs, other parts of body or loss of life  Informed patient about role of CRNA in peri- and intra-operative care.  Patient voiced understanding.)        Anesthesia Quick Evaluation

## 2021-10-29 NOTE — Anesthesia Procedure Notes (Signed)
Anesthesia Regional Block: Interscalene brachial plexus block   Pre-Anesthetic Checklist: , timeout performed,  Correct Patient, Correct Site, Correct Laterality,  Correct Procedure,, risks and benefits discussed,  Surgical consent,  Pre-op evaluation,  At surgeon's request and post-op pain management  Laterality: Right  Prep: chloraprep       Needles:  Injection technique: Single-shot  Needle Type: Echogenic Needle          Additional Needles:   Procedures:,,,, ultrasound used (permanent image in chart),,   Motor weakness within 20 minutes.  Narrative:  Start time: 10/29/2021 7:15 AM End time: 10/29/2021 7:18 AM Injection made incrementally with aspirations every 5 mL.  Performed by: Personally  Anesthesiologist: Reed Breech, MD  Additional Notes: Functioning IV was confirmed and monitors applied.  Sterile prep and drape, hand hygiene and sterile gloves were used. Ultrasound guidance: relevant anatomy identified, needle position confirmed, local anesthetic spread visualized around nerve(s), vascular puncture avoided.  Image saved to electronic medical record.  Negative aspiration prior to incremental administration of local anesthetic for total 20 ml Exparel and 10 ml bupivacaine 0.5% given in interscalene distribution. The patient tolerated the procedure well. Vital signs and moderate sedation medications recorded in RN notes.

## 2021-11-09 ENCOUNTER — Encounter: Payer: Self-pay | Admitting: Orthopedic Surgery

## 2022-07-28 ENCOUNTER — Emergency Department: Payer: BC Managed Care – PPO

## 2022-07-28 ENCOUNTER — Other Ambulatory Visit: Payer: Self-pay

## 2022-07-28 ENCOUNTER — Emergency Department
Admission: EM | Admit: 2022-07-28 | Discharge: 2022-07-28 | Disposition: A | Discharge status: Home / Self Care | Payer: BC Managed Care – PPO | Attending: Emergency Medicine | Admitting: Emergency Medicine

## 2022-07-28 ENCOUNTER — Ambulatory Visit
Admission: EM | Admit: 2022-07-28 | Discharge: 2022-07-28 | Payer: BC Managed Care – PPO | Attending: Family Medicine | Admitting: Family Medicine

## 2022-07-28 DIAGNOSIS — R0789 Other chest pain: Secondary | ICD-10-CM | POA: Diagnosis present

## 2022-07-28 DIAGNOSIS — R079 Chest pain, unspecified: Secondary | ICD-10-CM

## 2022-07-28 DIAGNOSIS — R7989 Other specified abnormal findings of blood chemistry: Secondary | ICD-10-CM | POA: Insufficient documentation

## 2022-07-28 DIAGNOSIS — R42 Dizziness and giddiness: Secondary | ICD-10-CM | POA: Insufficient documentation

## 2022-07-28 DIAGNOSIS — R110 Nausea: Secondary | ICD-10-CM | POA: Diagnosis not present

## 2022-07-28 DIAGNOSIS — M25512 Pain in left shoulder: Secondary | ICD-10-CM | POA: Insufficient documentation

## 2022-07-28 LAB — CBC
HCT: 48.1 % — ABNORMAL HIGH (ref 36.0–46.0)
Hemoglobin: 15.8 g/dL — ABNORMAL HIGH (ref 12.0–15.0)
MCH: 28.3 pg (ref 26.0–34.0)
MCHC: 32.8 g/dL (ref 30.0–36.0)
MCV: 86.2 fL (ref 80.0–100.0)
Platelets: 298 10*3/uL (ref 150–400)
RBC: 5.58 MIL/uL — ABNORMAL HIGH (ref 3.87–5.11)
RDW: 12.8 % (ref 11.5–15.5)
WBC: 10 10*3/uL (ref 4.0–10.5)
nRBC: 0 % (ref 0.0–0.2)

## 2022-07-28 LAB — BASIC METABOLIC PANEL
Anion gap: 10 (ref 5–15)
BUN: 17 mg/dL (ref 6–20)
CO2: 27 mmol/L (ref 22–32)
Calcium: 9 mg/dL (ref 8.9–10.3)
Chloride: 98 mmol/L (ref 98–111)
Creatinine, Ser: 0.81 mg/dL (ref 0.44–1.00)
GFR, Estimated: >60 mL/min (ref >60)
Glucose, Bld: 103 mg/dL — ABNORMAL HIGH (ref 70–99)
Potassium: 3.9 mmol/L (ref 3.5–5.1)
Sodium: 135 mmol/L (ref 135–145)

## 2022-07-28 LAB — TROPONIN I (HIGH SENSITIVITY)
Troponin I (High Sensitivity): 3 ng/L (ref <18)
Troponin I (High Sensitivity): 3 ng/L (ref <18)

## 2022-07-28 LAB — HEPATIC FUNCTION PANEL
ALT: 49 U/L — ABNORMAL HIGH (ref 0–44)
AST: 56 U/L — ABNORMAL HIGH (ref 15–41)
Albumin: 3.9 g/dL (ref 3.5–5.0)
Alkaline Phosphatase: 50 U/L (ref 38–126)
Bilirubin, Direct: 0.2 mg/dL (ref 0.0–0.2)
Indirect Bilirubin: 0.8 mg/dL (ref 0.3–0.9)
Total Bilirubin: 1 mg/dL (ref 0.3–1.2)
Total Protein: 7.4 g/dL (ref 6.5–8.1)

## 2022-07-28 LAB — LIPASE, BLOOD: Lipase: 37 U/L (ref 11–51)

## 2022-07-28 MED ORDER — ALUM & MAG HYDROXIDE-SIMETH 200-200-20 MG/5ML PO SUSP
30.0000 mL | Freq: Once | ORAL | Status: AC
  Administered 2022-07-28: 30 mL via ORAL
  Filled 2022-07-28: qty 30

## 2022-07-28 MED ORDER — FAMOTIDINE 20 MG PO TABS
20.0000 mg | ORAL_TABLET | Freq: Once | ORAL | Status: AC
  Administered 2022-07-28: 20 mg via ORAL
  Filled 2022-07-28: qty 1

## 2022-07-28 MED ORDER — LIDOCAINE VISCOUS HCL 2 % MT SOLN
15.0000 mL | Freq: Once | OROMUCOSAL | Status: AC
  Administered 2022-07-28: 15 mL via ORAL
  Filled 2022-07-28: qty 15

## 2022-07-28 NOTE — ED Notes (Signed)
Patient is being discharged from the Urgent Care and sent to the Emergency Department via POV . Per Lyndee Hensen, DO patient is in need of higher level of care due to chest pain. Patient is aware and verbalizes understanding of plan of care.  Vitals:   07/28/22 1304  BP: 127/87  Pulse: 89  Temp: 98.1 F (36.7 C)  SpO2: 98%

## 2022-07-28 NOTE — ED Provider Notes (Addendum)
[facility] Provider Note    Event Date/Time   First MD Initiated Contact with Patient 07/28/22 1637     (approximate)   History   Chest Pain, Shoulder Pain, and Dizziness   HPI  Monica Preston is a 57 y.o. female   Past medical history of hypertension presents to the emergency department with left-sided chest pain.  She has had 2 days of symptoms intermittently.  First occurred yesterday after going to the restroom she stood up felt little bit dizzy nauseous and had left-sided chest/shoulder pain.  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  She denies any trauma or recent illnesses.  She denies cough or fever.  Has no leg pain or swelling.  No immobilization or recent surgeries hospitalization.  Independent Historian contributed to assessment above: Husband at bedside  External Medical Documents Reviewed: Urgent care visit dated earlier today 07/28/2022 substernal chest pain advised to come to the emergency department.  They note some questionable hypotension low blood pressure taken by family members at home.  No hypotension noted in urgent care.      Physical Exam   Triage Vital Signs: ED Triage Vitals  Enc Vitals Group     BP 07/28/22 1603 (!) 149/78     Pulse Rate 07/28/22 1603 90     Resp 07/28/22 1603 18     Temp 07/28/22 1603 98.2 F (36.8 C)     Temp Source 07/28/22 1603 Oral     SpO2 07/28/22 1603 99 %     Weight 07/28/22 1350 289 lb 14.5 oz (131.5 kg)     Height 07/28/22 1350 5' 8"$  (1.727 m)     Head Circumference --      Peak Flow --      Pain Score 07/28/22 1350 2     Pain Loc --      Pain Edu? --      Excl. in [place]? --     Most recent vital signs: Vitals:   07/28/22 1603 07/28/22 1645  BP: (!) 149/78   Pulse: 90 88  Resp: 18 15  Temp: 98.2 F (36.8 C)   SpO2: 99% 95%    General: Awake, no distress.  CV:  Good peripheral perfusion.  Resp:  Normal effort. Abd:  No distention.  Other:  Awake alert oriented  comfortable appearing.  Lungs clear no focality or wheezing.  Abdomen soft and nontender in all quadrants.  Skin appears warm well-perfused.  No hypoxemia, mild hypertension 140s over 70s otherwise normal vital signs.  No unilateral leg swelling or pain.  Has tenderness to palpation in the left chest just in front of the axilla.   ED Results / Procedures / Treatments   Labs (all labs ordered are listed, but only abnormal results are displayed) Labs Reviewed  BASIC METABOLIC PANEL - Abnormal; Notable for the following components:      Result Value   Glucose, Bld 103 (*)    All other components within normal limits  CBC - Abnormal; Notable for the following components:   RBC 5.58 (*)    Hemoglobin 15.8 (*)    HCT 48.1 (*)    All other components within normal limits  HEPATIC FUNCTION PANEL - Abnormal; Notable for the following components:   AST 56 (*)    ALT 49 (*)    All other components within normal limits  LIPASE, BLOOD  TROPONIN I (HIGH SENSITIVITY)  TROPONIN I (HIGH SENSITIVITY)     I  ordered and reviewed the above labs they are notable for troponins are flat, 3, 3  EKG  ED ECG REPORT I, Lucillie Garfinkel, the attending physician, personally viewed and interpreted this ECG.   Date: 07/28/2022  EKG Time: 1353  Rate: 90  Rhythm: sinus  Axis: nl  Intervals:none  ST&T Change: No acute ischemic changes    RADIOLOGY I independently reviewed and interpreted chest x-ray see no obvious focality or pneumothorax   PROCEDURES:  Critical Care performed: No  Procedures   MEDICATIONS ORDERED IN ED: Medications  alum & mag hydroxide-simeth (MAALOX/MYLANTA) 200-200-20 MG/5ML suspension 30 mL (30 mLs Oral Given 07/28/22 1717)    And  lidocaine (XYLOCAINE) 2 % viscous mouth solution 15 mL (15 mLs Oral Given 07/28/22 1717)  famotidine (PEPCID) tablet 20 mg (20 mg Oral Given 07/28/22 1717)     IMPRESSION / MDM / ASSESSMENT AND PLAN / ED COURSE  I reviewed the triage vital signs  and the nursing notes.                                Patient's presentation is most consistent with acute presentation with potential threat to life or bodily function.  Differential diagnosis includes, but is not limited to, acs, dissection, PE, GERD/gastritis, biliary pathology, pancreatitis   The patient is on the cardiac monitor to evaluate for evidence of arrhythmia and/or significant heart rate changes.  MDM: This is a patient with atypical chest pain intermittent fleeting nonsustained nonexertional.  No respiratory infectious symptoms.  No shortness of breath leg swelling or history of blood clots, normal vital signs I have very low clinical suspicion for PE, very low clinical suspicion for dissection as well given very atypical symptoms.  Consider GERD/biliary/pancreatitis but benign abdomen exam.  LFTs and lipase are pending.  Troponins remain flat x 2 and EKG is nonischemic.  She is asymptomatic currently.  He has point tenderness and a quarter sized location in her left chest wall without overlying signs of infection, masses, fluctuance.  Her dizziness consider CVA dehydration but labs within normal limits she appears euvolemic and she has no symptoms now I walked her and she has steady gait no dizziness no nausea.  She has no motor or sensory deficits no dysarthria or facial asymmetry normal finger-nose.  I highly doubt CVA.  LFTs were slightly elevated in the 40s/50s, but her abdomen is soft and nontender and she is status postcholecystectomy.  I do not think these liver enzymes are significant or contributing to her left-sided chest pain today.  I asked her to follow-up to get them rechecked  I considered hospitalization for admission or observation but given normal workup as above and asymptomatic at this time I think this patient is safe to go home and follow-up with her doctor as an outpatient.  I gave her strict return precautions given to the emergency department with if she  develops any new or worsening symptoms.        FINAL CLINICAL IMPRESSION(S) / ED DIAGNOSES   Final diagnoses:  Atypical chest pain  Elevated LFTs     Rx / DC Orders   ED Discharge Orders     None        Note:  This document was prepared using Dragon voice recognition software and may include unintentional dictation errors.    Lucillie Garfinkel, MD 07/28/22 1714    Lucillie Garfinkel, MD 07/28/22 [phone]

## 2022-07-28 NOTE — ED Provider Notes (Signed)
[facility]    CSN: [id number] Arrival date & time: 07/28/22  1248      History   Chief Complaint Chief Complaint  Patient presents with   Chest Pain   Headache    HPI Monica Preston is a 57 y.o. female.   HPI  [name] here for substernal chest pain that starte yesterday afternoon. She had dizziness and felt sick to her stomach. She had pain between her shoulder blades and then felt like she was going to pass out. She broke out in a sweat.  She laid on the floor at work then felt better so she went home. Her husbnad took her BP and it was low (90s/50s).  She went to bed and had "discomfort" in her left shoulder which persists.   Today at the school, she started getting a headache and felt dizzy again. She went to see the school RN who told her BP was elevated (systolic [id number]).   She has never seen a heart doctor before. She is not have chest pain now but continues to have discomfort in her shoulder. She denies jaw and neck pain.     Tobacco use no   Past Medical History:  Diagnosis Date   Anxiety    Arthritis    Depression    GERD (gastroesophageal reflux disease)    GERD (gastroesophageal reflux disease)    Hypertension     There are no problems to display for this patient.   Past Surgical History:  Procedure Laterality Date   ABDOMINAL HYSTERECTOMY     CESAREAN SECTION  1991   CHOLECYSTECTOMY     SHOULDER ARTHROSCOPY WITH ROTATOR CUFF REPAIR AND OPEN BICEPS TENODESIS  11/18/2019   Procedure: SHOULDER ARTHROSCOPY WITH ROTATOR CUFF REPAIR AND BICEPS TENODESIS;  Surgeon: Leim Fabry, MD;  Location: [facility] ORS;  Service: Orthopedics;;   SHOULDER ARTHROSCOPY WITH SUBACROMIAL DECOMPRESSION AND OPEN ROTATOR C Right 10/29/2021   Procedure: Right shoulder arthroscopic rotator cuff repair, biceps tenodesis and subacromial decompression;  Surgeon: Leim Fabry, MD;  Location: [facility] OR[facility];  Service: Orthopedics;  Laterality: Right;    OB History   No obstetric  history on file.      Home Medications    Prior to Admission medications   Medication Sig Start Date End Date Taking? Authorizing Provider  buPROPion (WELLBUTRIN SR) 150 MG 12 hr tablet Take 150 mg by mouth daily. 10/04/19  Yes [provider]  CALCIUM PO Take 1 tablet by mouth daily.   Yes [provider]  Cholecalciferol (SM VITAMIN D3) 100 MCG (4000 UT) CAPS Take 4,000 Units by mouth daily.   Yes [provider]  estradiol (ESTRACE) 1 MG tablet Take 1 mg by mouth daily.   Yes [provider]  famotidine (PEPCID) 40 MG tablet Take 40 mg by mouth at bedtime. 10/02/19  Yes [provider]  Flaxseed, Linseed, (FLAXSEED OIL) 1200 MG CAPS Take 1,200 mg by mouth daily.   Yes [provider]  lisinopril-hydrochlorothiazide (ZESTORETIC) 20-12.5 MG tablet Take 1 tablet by mouth daily.    Yes [provider]  meclizine (ANTIVERT) 25 MG tablet Take 1 tablet (25 mg total) by mouth 3 (three) times daily as needed for dizziness. 09/25/20  Yes Verda Cumins, MD  pantoprazole (PROTONIX) 40 MG tablet Take 40 mg by mouth 2 (two) times daily. 10/02/19  Yes [provider]  SUMAtriptan (IMITREX) 25 MG tablet Take 25 mg by mouth every 2 (two) hours as needed for migraine. May  repeat in 2 hours if headache persists or recurs.   Yes [provider]  acetaminophen (TYLENOL) 500 MG tablet Take 2 tablets (1,000 mg total) by mouth every 8 (eight) hours. 10/29/21 10/29/22  Leim Fabry, MD  ondansetron (ZOFRAN ODT) 4 MG disintegrating tablet Take 1 tablet (4 mg total) by mouth every 8 (eight) hours as needed for nausea or vomiting. 11/18/19   Leim Fabry, MD  ondansetron (ZOFRAN-ODT) 4 MG disintegrating tablet Take 1 tablet (4 mg total) by mouth every 8 (eight) hours as needed for nausea or vomiting. 10/29/21   Leim Fabry, MD  oxyCODONE (ROXICODONE) 5 MG immediate release tablet Take 1-2 tablets (5-10 mg total) by mouth every 4 (four) hours as  needed (pain). 10/29/21 10/29/22  Leim Fabry, MD    Family History Family History  Problem Relation Age of Onset   Hypertension Mother    Diabetes Mother    Heart failure Father     Social History Social History   Tobacco Use   Smoking status: Never    Passive exposure: Yes   Smokeless tobacco: Never  Vaping Use   Vaping Use: Never used  Substance Use Topics   Alcohol use: No   Drug use: No     Allergies   Patient has no known allergies.   Review of Systems Review of Systems :negative unless otherwise stated in HPI.      Physical Exam Triage Vital Signs ED Triage Vitals  Enc Vitals Group     BP      Pulse      Resp      Temp      Temp src      SpO2      Weight      Height      Head Circumference      Peak Flow      Pain Score      Pain Loc      Pain Edu?      Excl. in [place]?    No data found.  Updated Vital Signs BP 127/87 (BP Location: Right Arm)   Pulse 89   Temp 98.1 F (36.7 C) (Oral)   Ht 5' 8"$  (1.727 m)   Wt 131.5 kg   SpO2 98%   BMI 44.09 kg/m   Visual Acuity Right Eye Distance:   Left Eye Distance:   Bilateral Distance:    Right Eye Near:   Left Eye Near:    Bilateral Near:     Physical Exam  GEN: well appearing female, in no acute distress  CV: regular rate and rhythm,  no murmurs, rubs or gallops  appreciated, no JVP  CHEST WALL: non-tender to palpation RESP: no increased work of breathing, clear to ascultation bilaterally ABD: Bowel sounds present. Soft, non-tender, non-distended.  MSK: no LE edema, or calf tenderness SKIN: warm, dry, no rash on visible skin NEURO: alert, moves all extremities appropriately PSYCH: Normal affect, appropriate speech and behavior   UC Treatments / Results  Labs (all labs ordered are listed, but only abnormal results are displayed) Labs Reviewed - No data to display  EKG   Radiology No results found.  Procedures Procedures (including critical care time)  Medications Ordered in  UC Medications - No data to display  Initial Impression / Assessment and Plan / UC Course  I have reviewed the triage vital signs and the nursing notes.  Pertinent labs & imaging results that were available during my care of the patient were  reviewed by me and considered in my medical decision making (see chart for details).       Patient is a 57 y.o. female with history of HTN, GERD  who presents with 1-2 days of substernal chest pain. She had nausea, diaphoresis, chest pain, back pain and shoulder pain in the past 2 days with questionable hypotension at home. EKG without acute ST or T wave changes. She has elevated heart score. Discussed ED evaluation for cardiac workup and she is agreeable. Her husband will [facility] her [facility] ED. Pt stable for discharge to ED.   Discussed MDM, treatment plan and plan for follow-up with patient who agrees with plan.         Final Clinical Impressions(s) / UC Diagnoses   Final diagnoses:  Chest pain, unspecified type     Discharge Instructions      You have been advised to follow up immediately in the emergency department for concerning signs or symptoms as discussed during your visit. If you declined EMS transport, please have a family member take you directly to the ED at this time. Do not delay.   Based on concerns about condition, if you do not follow up in the ED, you may risk poor outcomes including worsening of condition, delayed treatment and potentially life threatening issues. If you have declined to go to the ED at this time, you should call your PCP immediately to set up a follow up appointment.   Go to ED for red flag symptoms, including; fevers you cannot reduce with Tylenol/Motrin, severe headaches, vision changes, numbness/weakness in part of the body, lethargy, confusion, intractable vomiting, severe dehydration, chest pain, breathing difficulty, severe persistent abdominal or pelvic pain, signs of severe infection  (increased redness, swelling of an area), feeling faint or passing out, dizziness, etc. You should especially go to the ED for sudden acute worsening of condition if you do not elect to go at this time.       ED Prescriptions   None    PDMP not reviewed this encounter.   Lyndee Hensen, DO 07/28/22 1333

## 2022-07-28 NOTE — ED Triage Notes (Signed)
Pt reports last pm she was having chest pain, left shoulder pain, began sweating and states her bp dropped, pt states today while at school she started having chest pain again as well as a headache

## 2022-07-28 NOTE — Discharge Instructions (Signed)

## 2022-07-28 NOTE — ED Notes (Signed)
Repeat VS obtained; repeat trop collected and sent to lab at this time.

## 2022-07-28 NOTE — ED Triage Notes (Signed)
Pt reports last night felt near syncope, got really dizzy, pt reports she was aching in between shoulder blades, pt reports hx of hypertension and BP reading  was low (94/56), LT shoulder discomfort. Pt states she was at work today and started feeling a pressure in chest.

## 2022-07-28 NOTE — Discharge Instructions (Addendum)
Thank you for choosing [facility] for your health care today!  Please see your primary doctor this week for a follow up appointment.  Have them recheck your liver enzymes which were ever so slightly elevated today, though I do not think this is contributing to your symptoms of left-sided chest pain.  Sometimes, in the early stages of certain disease courses it is difficult to detect in the emergency department evaluation -- so, it is important that you continue to monitor your symptoms and call your doctor right away or return to the emergency department if you develop any new or worsening symptoms.  Please go to the following website to schedule new (and existing) patient appointments:   [email or web address]  If you do not have a primary doctor try calling the following clinics to establish care:  If you have insurance:  [facility] [phone] [place] [address]   [facility]  [phone] [place] [place] [address]   If you do not have insurance:  [facility]  [phone] [address], [place] [address]   The following is another list of primary care offices in the area who are accepting new patients at this time.  Please reach out to one of them directly and let them know you would like to schedule an appointment to follow up on an Emergency Department visit, and/or to establish a new primary care provider (PCP).  There are likely other primary care clinics in the are who are accepting new patients, but this is an excellent place to start:  [place] physician: Dr Lavon Paganini [address] [place], [place] [address] [phone]  [facility] Lead Physician: Dr Steele Sizer [address], [place], [place] [address] [phone]  [place] Physician: Dr Park Liter [address] [place], [place]  [address] [phone]  [facility] Lead Physician: Dr Dewaine Oats [place], [place], [place] [address] [phone]  [facility] Physician: Dr Halina Maidens [address] [place], [place] [address] [phone]   It was my pleasure to care for you today.   Hoover Brunette Jacelyn Grip, MD

## 2022-08-01 ENCOUNTER — Other Ambulatory Visit: Payer: Self-pay | Admitting: Family Medicine

## 2022-08-01 DIAGNOSIS — M5417 Radiculopathy, lumbosacral region: Secondary | ICD-10-CM

## 2022-08-01 DIAGNOSIS — R0789 Other chest pain: Secondary | ICD-10-CM

## 2022-08-01 DIAGNOSIS — R7989 Other specified abnormal findings of blood chemistry: Secondary | ICD-10-CM

## 2022-08-08 ENCOUNTER — Other Ambulatory Visit: Payer: Self-pay | Admitting: Family Medicine

## 2022-08-08 DIAGNOSIS — Z1231 Encounter for screening mammogram for malignant neoplasm of breast: Secondary | ICD-10-CM

## 2022-08-09 ENCOUNTER — Ambulatory Visit
Admission: RE | Admit: 2022-08-09 | Discharge: 2022-08-09 | Disposition: A | Discharge status: Home / Self Care | Payer: BC Managed Care – PPO | Source: Ambulatory Visit | Attending: Family Medicine | Admitting: Family Medicine

## 2022-08-09 DIAGNOSIS — R7989 Other specified abnormal findings of blood chemistry: Secondary | ICD-10-CM | POA: Insufficient documentation

## 2022-08-09 DIAGNOSIS — R0789 Other chest pain: Secondary | ICD-10-CM | POA: Diagnosis present

## 2022-08-11 ENCOUNTER — Ambulatory Visit
Admission: RE | Admit: 2022-08-11 | Discharge: 2022-08-11 | Disposition: A | Discharge status: Home / Self Care | Payer: BC Managed Care – PPO | Source: Ambulatory Visit | Attending: Family Medicine | Admitting: Family Medicine

## 2022-08-11 DIAGNOSIS — M5417 Radiculopathy, lumbosacral region: Secondary | ICD-10-CM | POA: Diagnosis present

## 2022-09-01 ENCOUNTER — Ambulatory Visit: Payer: BC Managed Care – PPO

## 2022-09-19 ENCOUNTER — Ambulatory Visit
Admission: RE | Admit: 2022-09-19 | Discharge: 2022-09-19 | Disposition: A | Discharge status: Home / Self Care | Payer: BC Managed Care – PPO | Source: Ambulatory Visit | Attending: Family Medicine | Admitting: Family Medicine

## 2022-09-19 DIAGNOSIS — Z1231 Encounter for screening mammogram for malignant neoplasm of breast: Secondary | ICD-10-CM | POA: Insufficient documentation

## 2022-09-22 ENCOUNTER — Other Ambulatory Visit: Payer: Self-pay | Admitting: Family Medicine

## 2022-09-22 DIAGNOSIS — Z1231 Encounter for screening mammogram for malignant neoplasm of breast: Secondary | ICD-10-CM

## 2022-09-23 ENCOUNTER — Other Ambulatory Visit: Payer: Self-pay | Admitting: *Deleted

## 2022-09-23 ENCOUNTER — Inpatient Hospital Stay
Admission: RE | Admit: 2022-09-23 | Discharge: 2022-09-23 | Disposition: A | Discharge status: Home / Self Care | Payer: Self-pay | Source: Ambulatory Visit | Attending: Family Medicine | Admitting: Family Medicine

## 2022-09-23 DIAGNOSIS — Z1231 Encounter for screening mammogram for malignant neoplasm of breast: Secondary | ICD-10-CM

## 2023-08-05 IMAGING — MR MR SHOULDER*R* W/O CM
6 series · 40 of 40 positions shown · non-contrast
Comparison: None.

CLINICAL DATA: Right shoulder pain for 2 years, but worse the past
6 months. Patient reports unable to reach up or behind without pain,
and has increasing weakness in right arm.

EXAM:
MRI OF THE RIGHT SHOULDER WITHOUT CONTRAST
TECHNIQUE: Multiplanar, multisequence MR imaging of the shoulder was performed.
No intravenous contrast was administered.

[Series 3: T2 fat-sat · axial · 4.0mm · 0.55mm/px · z∈[-26,+79]mm · 6 of 25 slices shown (1 of 4)]
[im 1/25]
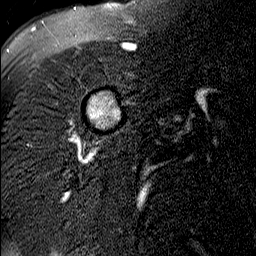
[im 5/25]
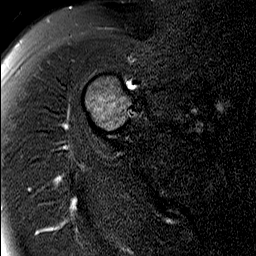
[im 10/25]
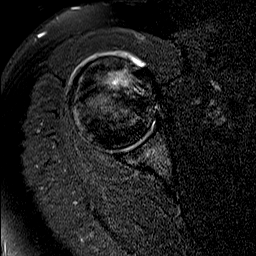
[im 15/25]
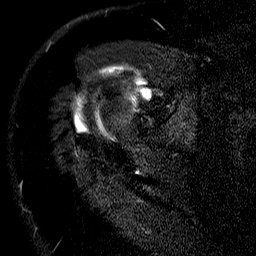
[im 20/25]
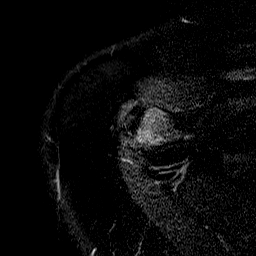
[im 25/25]
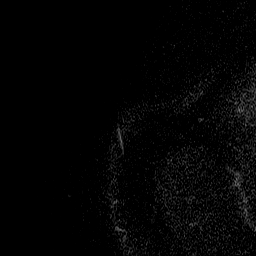

[Series 4: T2 fat-sat · oblique · 4.0mm · 0.55mm/px · 6 of 25 slices shown (2 of 4)]
[im 1/25]
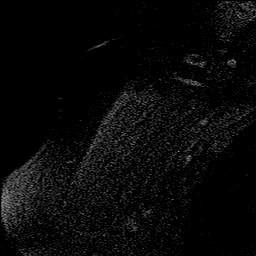
[im 5/25]
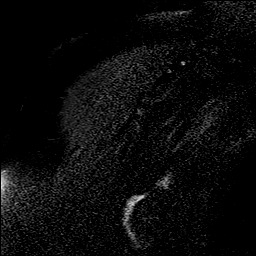
[im 10/25]
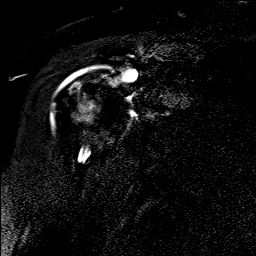
[im 15/25]
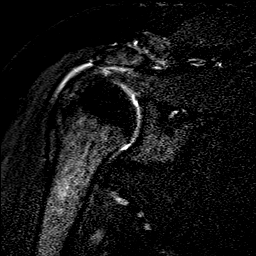
[im 20/25]
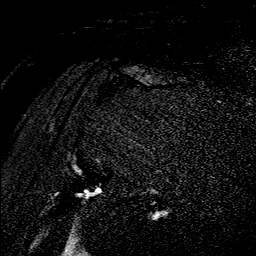
[im 25/25]
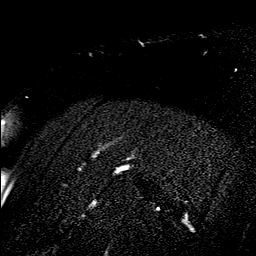

[Series 5: PD · oblique · 4.0mm · 0.55mm/px · 7 of 25 slices shown]
[im 1/25]
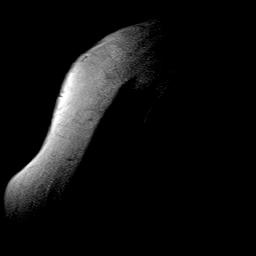
[im 5/25]
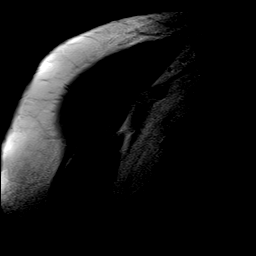
[im 9/25]
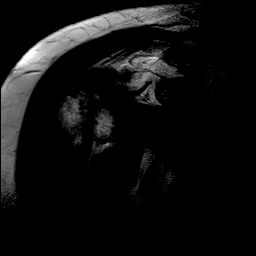
[im 13/25]
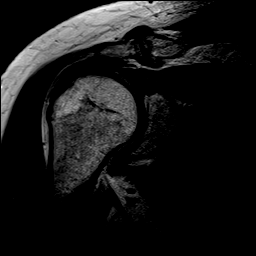
[im 17/25]
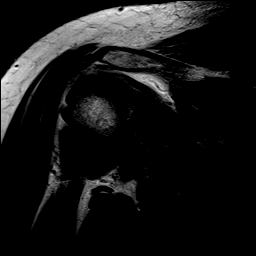
[im 21/25]
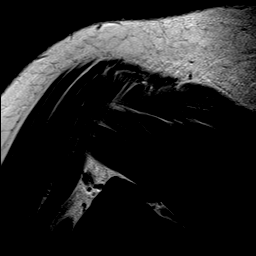
[im 25/25]
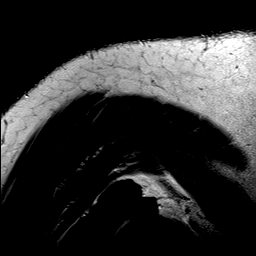

[Series 6: T1 · oblique · 4.0mm · 0.59mm/px · 7 of 25 slices shown]
[im 1/25]
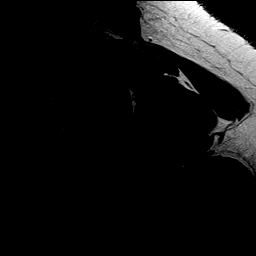
[im 5/25]
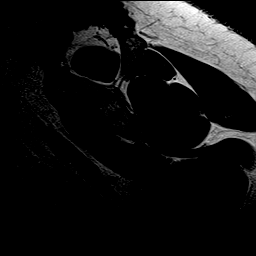
[im 9/25]
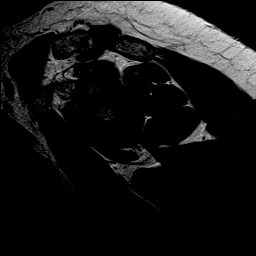
[im 13/25]
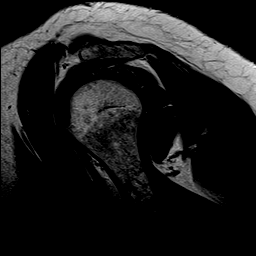
[im 17/25]
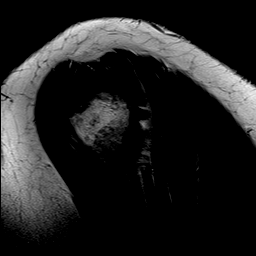
[im 21/25]
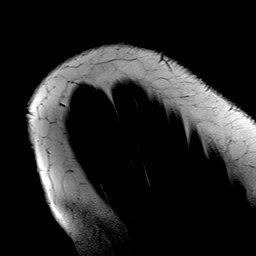
[im 25/25]
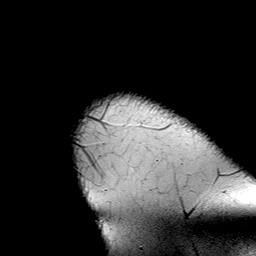

[Series 7: T2 fat-sat · oblique · 4.0mm · 0.59mm/px · 7 of 25 slices shown (3 of 4)]
[im 1/25]
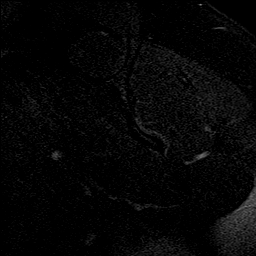
[im 5/25]
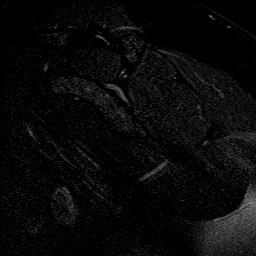
[im 9/25]
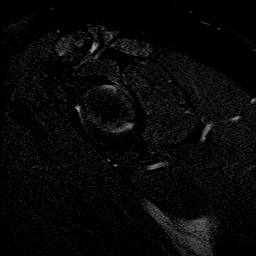
[im 13/25]
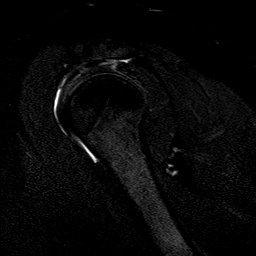
[im 17/25]
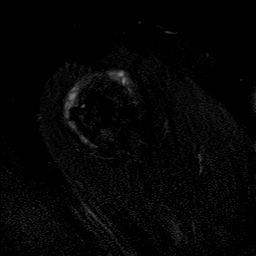
[im 21/25]
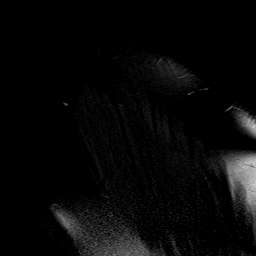
[im 25/25]
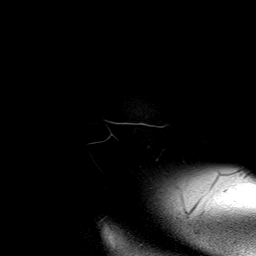

[Series 8: T2 fat-sat · axial · 4.0mm · 0.55mm/px · z∈[-26,+79]mm · 7 of 25 slices shown (4 of 4)]
[im 1/25]
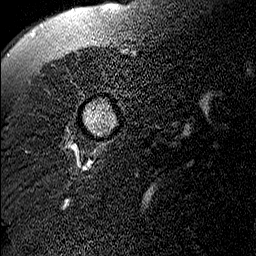
[im 5/25]
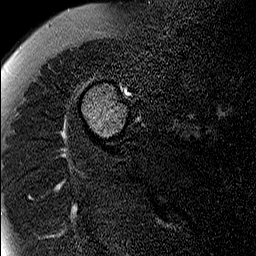
[im 9/25]
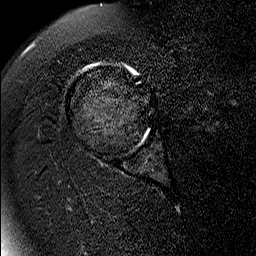
[im 13/25]
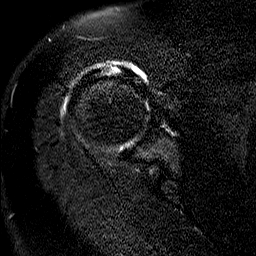
[im 17/25]
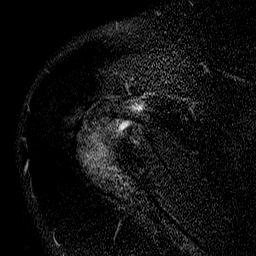
[im 21/25]
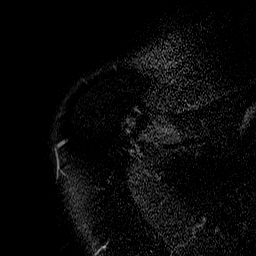
[im 25/25]
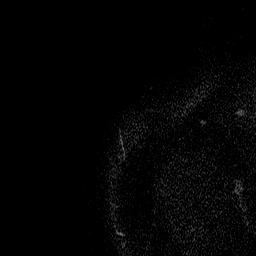

[40 of 40 positions shown; findings below may reference images not displayed]

FINDINGS: Rotator cuff: Moderate tendinosis of the supraspinatus tendon with a
small full-thickness tear anteriorly. Mild tendinosis of the
infraspinatus tendon. Small partial-thickness tear along the bursal
surface at the junction of the supraspinatus and infraspinatus
tendons 16 mm from the peripheral insertion likely secondary to
subacromial impingement. Teres minor tendon is intact. Subscapularis
tendon is intact.

Muscles: No muscle atrophy or edema. No intramuscular fluid
collection or hematoma.

Biceps Long Head: Intraarticular and extraarticular portions of the
biceps tendon are intact.

Acromioclavicular Joint: Moderate arthropathy of the
acromioclavicular joint. Small amount of subacromial/subdeltoid
bursal fluid.

Glenohumeral Joint: No joint effusion. Mild partial-thickness
cartilage loss of the glenohumeral joint.

Labrum: Grossly intact, but evaluation is limited by lack of
intraarticular fluid/contrast.

Bones: No fracture or dislocation. No aggressive osseous lesion.

Other: No fluid collection or hematoma.
IMPRESSION: 1. Moderate tendinosis of the supraspinatus tendon with a small
full-thickness tear anteriorly. Mild tendinosis of the infraspinatus
tendon. Small partial-thickness tear along the bursal surface at the
junction of the supraspinatus and infraspinatus tendons 16 mm from
the peripheral insertion likely secondary to subacromial
impingement.
